# Patient Record
Sex: Female | Born: 1970 | Race: White | Hispanic: No
Health system: Southern US, Community
[De-identification: ages and names within clinical notes are randomized; demographics above are authoritative.]

## PROBLEM LIST (undated history)

## (undated) DIAGNOSIS — Z9889 Other specified postprocedural states: Secondary | ICD-10-CM

## (undated) DIAGNOSIS — M1990 Unspecified osteoarthritis, unspecified site: Secondary | ICD-10-CM

## (undated) DIAGNOSIS — N390 Urinary tract infection, site not specified: Secondary | ICD-10-CM

## (undated) DIAGNOSIS — R112 Nausea with vomiting, unspecified: Secondary | ICD-10-CM

## (undated) DIAGNOSIS — K219 Gastro-esophageal reflux disease without esophagitis: Secondary | ICD-10-CM

## (undated) DIAGNOSIS — Z8742 Personal history of other diseases of the female genital tract: Secondary | ICD-10-CM

## (undated) DIAGNOSIS — T8859XA Other complications of anesthesia, initial encounter: Secondary | ICD-10-CM

## (undated) DIAGNOSIS — U071 COVID-19: Secondary | ICD-10-CM

## (undated) DIAGNOSIS — R51 Headache: Secondary | ICD-10-CM

## (undated) DIAGNOSIS — B009 Herpesviral infection, unspecified: Secondary | ICD-10-CM

## (undated) HISTORY — PX: BREAST SURGERY: SHX581

## (undated) HISTORY — PX: TUBAL LIGATION: SHX77

## (undated) HISTORY — PX: WISDOM TOOTH EXTRACTION: SHX21

## (undated) HISTORY — PX: URETHRAL STRICTURE DILATATION: SHX477

---

## 1997-06-29 ENCOUNTER — Emergency Department (HOSPITAL_COMMUNITY): Admission: EM | Admit: 1997-06-29 | Discharge: 1997-06-30 | Payer: Self-pay | Admitting: Internal Medicine

## 1997-08-01 ENCOUNTER — Emergency Department (HOSPITAL_COMMUNITY): Admission: EM | Admit: 1997-08-01 | Discharge: 1997-08-02 | Payer: Self-pay | Admitting: Emergency Medicine

## 1997-08-02 ENCOUNTER — Ambulatory Visit (HOSPITAL_COMMUNITY): Admission: RE | Admit: 1997-08-02 | Discharge: 1997-08-02 | Payer: Self-pay | Admitting: Emergency Medicine

## 1997-08-17 ENCOUNTER — Other Ambulatory Visit: Admission: RE | Admit: 1997-08-17 | Discharge: 1997-08-17 | Payer: Self-pay | Admitting: Obstetrics and Gynecology

## 1997-12-01 ENCOUNTER — Ambulatory Visit (HOSPITAL_COMMUNITY): Admission: RE | Admit: 1997-12-01 | Discharge: 1997-12-01 | Payer: Self-pay | Admitting: Obstetrics and Gynecology

## 1998-03-07 ENCOUNTER — Inpatient Hospital Stay (HOSPITAL_COMMUNITY): Admission: AD | Admit: 1998-03-07 | Discharge: 1998-03-10 | Payer: Self-pay | Admitting: Obstetrics and Gynecology

## 1998-03-14 ENCOUNTER — Inpatient Hospital Stay (HOSPITAL_COMMUNITY): Admission: AD | Admit: 1998-03-14 | Discharge: 1998-03-14 | Payer: Self-pay | Admitting: Obstetrics & Gynecology

## 1999-04-06 ENCOUNTER — Encounter: Payer: Self-pay | Admitting: Emergency Medicine

## 1999-04-06 ENCOUNTER — Emergency Department (HOSPITAL_COMMUNITY): Admission: EM | Admit: 1999-04-06 | Discharge: 1999-04-07 | Payer: Self-pay | Admitting: Emergency Medicine

## 2000-12-24 ENCOUNTER — Emergency Department (HOSPITAL_COMMUNITY): Admission: EM | Admit: 2000-12-24 | Discharge: 2000-12-24 | Payer: Self-pay | Admitting: Emergency Medicine

## 2005-11-02 ENCOUNTER — Emergency Department (HOSPITAL_COMMUNITY): Admission: EM | Admit: 2005-11-02 | Discharge: 2005-11-03 | Payer: Self-pay | Admitting: Emergency Medicine

## 2007-06-02 ENCOUNTER — Emergency Department (HOSPITAL_COMMUNITY): Admission: EM | Admit: 2007-06-02 | Discharge: 2007-06-02 | Payer: Self-pay | Admitting: Emergency Medicine

## 2009-02-25 ENCOUNTER — Ambulatory Visit: Payer: Self-pay | Admitting: Internal Medicine

## 2010-01-30 ENCOUNTER — Encounter: Payer: Self-pay | Admitting: Family Medicine

## 2010-10-05 LAB — LIPASE, BLOOD: Lipase: 26

## 2010-10-05 LAB — COMPREHENSIVE METABOLIC PANEL
ALT: 15
Alkaline Phosphatase: 49
BUN: 13
CO2: 30
Chloride: 104
GFR calc non Af Amer: >60
Glucose, Bld: 118 — ABNORMAL HIGH
Potassium: 4.1
Sodium: 140
Total Bilirubin: 1.2
Total Protein: 7.3

## 2010-10-05 LAB — URINALYSIS, ROUTINE W REFLEX MICROSCOPIC
Bilirubin Urine: NEGATIVE
Nitrite: NEGATIVE
Protein, ur: NEGATIVE
Specific Gravity, Urine: 1.024
Urobilinogen, UA: 0.2

## 2010-10-05 LAB — DIFFERENTIAL
Basophils Absolute: 0.1
Basophils Relative: 1
Eosinophils Absolute: 0.3
Neutro Abs: 9.4 — ABNORMAL HIGH
Neutrophils Relative %: 75

## 2010-10-05 LAB — URINE MICROSCOPIC-ADD ON

## 2010-10-05 LAB — CBC
HCT: 41.6
Hemoglobin: 14.1
RBC: 4.58
RDW: 13.4

## 2010-10-05 LAB — GC/CHLAMYDIA PROBE AMP, GENITAL
Chlamydia, DNA Probe: NEGATIVE
GC Probe Amp, Genital: NEGATIVE

## 2010-10-05 LAB — POCT PREGNANCY, URINE: Operator id: 261601

## 2012-07-24 ENCOUNTER — Encounter (HOSPITAL_COMMUNITY): Payer: Self-pay | Admitting: Pharmacist

## 2012-07-25 ENCOUNTER — Encounter (HOSPITAL_COMMUNITY)
Admission: RE | Admit: 2012-07-25 | Discharge: 2012-07-25 | Disposition: A | Discharge status: Home / Self Care | Payer: Medicaid Other | Source: Ambulatory Visit | Attending: Obstetrics and Gynecology | Admitting: Obstetrics and Gynecology

## 2012-07-25 ENCOUNTER — Encounter (HOSPITAL_COMMUNITY): Payer: Self-pay

## 2012-07-25 DIAGNOSIS — Z01818 Encounter for other preprocedural examination: Secondary | ICD-10-CM | POA: Insufficient documentation

## 2012-07-25 DIAGNOSIS — Z01812 Encounter for preprocedural laboratory examination: Secondary | ICD-10-CM | POA: Insufficient documentation

## 2012-07-25 HISTORY — DX: Gastro-esophageal reflux disease without esophagitis: K21.9

## 2012-07-25 HISTORY — DX: Headache: R51

## 2012-07-25 LAB — CBC
MCH: 31.1 pg (ref 26.0–34.0)
MCHC: 34.9 g/dL (ref 30.0–36.0)
RDW: 13.3 % (ref 11.5–15.5)

## 2012-07-25 NOTE — Patient Instructions (Addendum)
Your procedure is scheduled on:08/02/12  Enter through the Main Entrance at :6am Pick up desk phone and dial [phone] and inform us of your arrival.  Please call [phone] if you have any problems the morning of surgery.  Remember: Do not eat food or drink liquids, including water, after midnight: Thursday  You may brush your teeth the morning of surgery.   DO NOT wear jewelry, eye make-up, lipstick,body lotion, or dark fingernail polish.  (Polished toes are ok) You may wear deodorant.  If you are to be admitted after surgery, leave suitcase in car until your room has been assigned. Patients discharged on the day of surgery will not be allowed to drive home. Wear loose fitting, comfortable clothes for your ride home.   /

## 2012-08-01 NOTE — H&P (Signed)
Erika Ford is an 42 y.o. female. She has been seen in the office over the past few months for persistent pelvic pain.  Pelvic ultrasound in June was normal.  She does have some symptoms c/w interstitial cystitis, but her vaginal fornices are tender  Pertinent Gynecological History: Last pap: abnormal: ASCUS with neg HPV Date: 07/2011 OB History: G7, P4034 SVD at term x 3, c-section and BTL with last delivery, EAB x 3   Menstrual History: No LMP recorded.    Past Medical History  Diagnosis Date  . GERD (gastroesophageal reflux disease)     no meds  . Headache(784.0)     h/o migraines    Past Surgical History  Procedure Laterality Date  . Tubal ligation    . Urethral stricture dilatation    C-section  No family history on file.  Social History:  reports that she has been smoking Cigarettes.  She has been smoking about 0.50 packs per day. She does not have any smokeless tobacco history on file. She reports that  drinks alcohol. She reports that she does not use illicit drugs.  Allergies: No Known Allergies  No prescriptions prior to admission    Review of Systems  Respiratory: Negative.   Cardiovascular: Negative.   Gastrointestinal: Negative.   Genitourinary: Positive for dysuria. Negative for urgency, frequency and hematuria.    There were no vitals taken for this visit. Physical Exam  Constitutional: She appears well-developed and well-nourished.  Cardiovascular: Normal rate, regular rhythm and normal heart sounds.   No murmur heard. Respiratory: Effort normal and breath sounds normal. No respiratory distress. She has no wheezes.  GI: Soft. She exhibits no distension and no mass. There is tenderness (slightly tender suprapubically). There is no rebound and no guarding.  Genitourinary: Vagina normal.  Uterus midplanar to retroverted, normal size, NT No adnexal mass or tenderness Tender vaginal fornices bilaterally    No results found for this or any previous  visit (from the past 24 hour(s)).  No results found.  Assessment/Plan: Chronic pelvic pain.  Discussed that this may be IC, but tenderness could also be from endometriosis.  Discussed all medical and surgical options, she wants to proceed with laparoscopy.  Procedure and risks discussed, as well as possibility that everything will look normal.  Will admit for diagnostic/possible operative laparoscopy.  Erika Ford D 08/01/2012, 6:59 PM

## 2012-08-02 ENCOUNTER — Encounter (HOSPITAL_COMMUNITY): Payer: Self-pay | Admitting: Anesthesiology

## 2012-08-02 ENCOUNTER — Ambulatory Visit (HOSPITAL_COMMUNITY): Payer: Medicaid Other | Admitting: Anesthesiology

## 2012-08-02 ENCOUNTER — Encounter (HOSPITAL_COMMUNITY): Payer: Self-pay | Admitting: *Deleted

## 2012-08-02 ENCOUNTER — Encounter (HOSPITAL_COMMUNITY)
Admission: AD | Disposition: A | Discharge status: Home / Self Care | Payer: Self-pay | Source: Ambulatory Visit | Attending: Obstetrics and Gynecology

## 2012-08-02 ENCOUNTER — Ambulatory Visit (HOSPITAL_COMMUNITY)
Admission: AD | Admit: 2012-08-02 | Discharge: 2012-08-02 | Disposition: A | Discharge status: Home / Self Care | Payer: Medicaid Other | Source: Ambulatory Visit | Attending: Obstetrics and Gynecology | Admitting: Obstetrics and Gynecology

## 2012-08-02 DIAGNOSIS — R102 Pelvic and perineal pain unspecified side: Secondary | ICD-10-CM | POA: Diagnosis present

## 2012-08-02 DIAGNOSIS — N949 Unspecified condition associated with female genital organs and menstrual cycle: Secondary | ICD-10-CM | POA: Insufficient documentation

## 2012-08-02 DIAGNOSIS — G8929 Other chronic pain: Secondary | ICD-10-CM | POA: Insufficient documentation

## 2012-08-02 HISTORY — DX: Urinary tract infection, site not specified: N39.0

## 2012-08-02 HISTORY — DX: Herpesviral infection, unspecified: B00.9

## 2012-08-02 HISTORY — PX: LAPAROSCOPY: SHX197

## 2012-08-02 SURGERY — LAPAROSCOPY OPERATIVE
Anesthesia: General | Site: Abdomen | Wound class: Clean Contaminated

## 2012-08-02 MED ORDER — LACTATED RINGERS IV SOLN
INTRAVENOUS | Status: DC
  Administered 2012-08-02 (×2): via INTRAVENOUS

## 2012-08-02 MED ORDER — BUPIVACAINE HCL (PF) 0.25 % IJ SOLN
INTRAMUSCULAR | Status: AC
  Filled 2012-08-02: qty 30

## 2012-08-02 MED ORDER — METOCLOPRAMIDE HCL 5 MG/ML IJ SOLN
10.0000 mg | Freq: Once | INTRAMUSCULAR | Status: AC
  Administered 2012-08-02: 10 mg via INTRAVENOUS

## 2012-08-02 MED ORDER — BUPIVACAINE HCL (PF) 0.25 % IJ SOLN
INTRAMUSCULAR | Status: DC | PRN
  Administered 2012-08-02: 5 mL

## 2012-08-02 MED ORDER — PROPOFOL 10 MG/ML IV EMUL
INTRAVENOUS | Status: AC
  Filled 2012-08-02: qty 20

## 2012-08-02 MED ORDER — ACETAMINOPHEN 160 MG/5ML PO SOLN
ORAL | Status: AC
  Administered 2012-08-02: 975 mg via ORAL
  Filled 2012-08-02: qty 40.6

## 2012-08-02 MED ORDER — LACTATED RINGERS IV SOLN
INTRAVENOUS | Status: DC
  Administered 2012-08-02: 125 mL/h via INTRAVENOUS
  Administered 2012-08-02: 07:00:00 via INTRAVENOUS

## 2012-08-02 MED ORDER — GLYCOPYRROLATE 0.2 MG/ML IJ SOLN
INTRAMUSCULAR | Status: AC
  Filled 2012-08-02: qty 5

## 2012-08-02 MED ORDER — ONDANSETRON HCL 4 MG/2ML IJ SOLN
INTRAMUSCULAR | Status: AC
  Filled 2012-08-02: qty 2

## 2012-08-02 MED ORDER — MIDAZOLAM HCL 2 MG/2ML IJ SOLN
INTRAMUSCULAR | Status: AC
  Filled 2012-08-02: qty 2

## 2012-08-02 MED ORDER — FENTANYL CITRATE 0.05 MG/ML IJ SOLN: 25.0000 ug | INTRAMUSCULAR | Status: DC | PRN

## 2012-08-02 MED ORDER — MIDAZOLAM HCL 5 MG/5ML IJ SOLN
INTRAMUSCULAR | Status: DC | PRN
  Administered 2012-08-02: 2 mg via INTRAVENOUS

## 2012-08-02 MED ORDER — FENTANYL CITRATE 0.05 MG/ML IJ SOLN
INTRAMUSCULAR | Status: AC
  Filled 2012-08-02: qty 5

## 2012-08-02 MED ORDER — LIDOCAINE HCL (CARDIAC) 20 MG/ML IV SOLN
INTRAVENOUS | Status: DC | PRN
  Administered 2012-08-02: 10 mg via INTRAVENOUS

## 2012-08-02 MED ORDER — ROCURONIUM BROMIDE 50 MG/5ML IV SOLN
INTRAVENOUS | Status: AC
  Filled 2012-08-02: qty 1

## 2012-08-02 MED ORDER — SODIUM CHLORIDE 0.9 % IJ SOLN
INTRAMUSCULAR | Status: DC | PRN
  Administered 2012-08-02: 10 mL

## 2012-08-02 MED ORDER — KETOROLAC TROMETHAMINE 30 MG/ML IJ SOLN
INTRAMUSCULAR | Status: DC | PRN
  Administered 2012-08-02: 30 mg via INTRAVENOUS

## 2012-08-02 MED ORDER — KETOROLAC TROMETHAMINE 30 MG/ML IJ SOLN
INTRAMUSCULAR | Status: AC
  Filled 2012-08-02: qty 1

## 2012-08-02 MED ORDER — PROMETHAZINE HCL 25 MG/ML IJ SOLN: 6.2500 mg | Freq: Once | INTRAMUSCULAR | Status: AC

## 2012-08-02 MED ORDER — GLYCOPYRROLATE 0.2 MG/ML IJ SOLN
INTRAMUSCULAR | Status: AC
  Filled 2012-08-02: qty 1

## 2012-08-02 MED ORDER — NEOSTIGMINE METHYLSULFATE 1 MG/ML IJ SOLN
INTRAMUSCULAR | Status: DC | PRN
  Administered 2012-08-02: 5 mg via INTRAVENOUS

## 2012-08-02 MED ORDER — KETOROLAC TROMETHAMINE 30 MG/ML IJ SOLN: 15.0000 mg | Freq: Once | INTRAMUSCULAR | Status: DC | PRN

## 2012-08-02 MED ORDER — FENTANYL CITRATE 0.05 MG/ML IJ SOLN
INTRAMUSCULAR | Status: DC | PRN
  Administered 2012-08-02: 150 ug via INTRAVENOUS
  Administered 2012-08-02: 100 ug via INTRAVENOUS

## 2012-08-02 MED ORDER — DEXAMETHASONE SODIUM PHOSPHATE 10 MG/ML IJ SOLN
INTRAMUSCULAR | Status: AC
  Filled 2012-08-02: qty 1

## 2012-08-02 MED ORDER — PROPOFOL 10 MG/ML IV BOLUS
INTRAVENOUS | Status: DC | PRN
  Administered 2012-08-02: 200 mg via INTRAVENOUS

## 2012-08-02 MED ORDER — METOCLOPRAMIDE HCL 5 MG/ML IJ SOLN
INTRAMUSCULAR | Status: AC
  Filled 2012-08-02: qty 2

## 2012-08-02 MED ORDER — LIDOCAINE HCL (CARDIAC) 20 MG/ML IV SOLN
INTRAVENOUS | Status: AC
  Filled 2012-08-02: qty 5

## 2012-08-02 MED ORDER — EPHEDRINE 5 MG/ML INJ
INTRAVENOUS | Status: AC
  Filled 2012-08-02: qty 10

## 2012-08-02 MED ORDER — HYDROCODONE-ACETAMINOPHEN 5-325 MG PO TABS: 1.0000 | ORAL_TABLET | ORAL | Status: DC | PRN

## 2012-08-02 MED ORDER — PROMETHAZINE HCL 25 MG/ML IJ SOLN
INTRAMUSCULAR | Status: AC
  Administered 2012-08-02: 6.25 mg via INTRAVENOUS
  Filled 2012-08-02: qty 1

## 2012-08-02 MED ORDER — ONDANSETRON HCL 4 MG/2ML IJ SOLN: INTRAMUSCULAR | Status: DC | PRN

## 2012-08-02 MED ORDER — GLYCOPYRROLATE 0.2 MG/ML IJ SOLN
INTRAMUSCULAR | Status: DC | PRN
  Administered 2012-08-02: 0.2 mg via INTRAVENOUS
  Administered 2012-08-02: 1 mg via INTRAVENOUS

## 2012-08-02 MED ORDER — EPHEDRINE SULFATE 50 MG/ML IJ SOLN
INTRAMUSCULAR | Status: DC | PRN
  Administered 2012-08-02: 10 mg via INTRAVENOUS

## 2012-08-02 MED ORDER — NEOSTIGMINE METHYLSULFATE 1 MG/ML IJ SOLN
INTRAMUSCULAR | Status: AC
  Filled 2012-08-02: qty 1

## 2012-08-02 MED ORDER — DEXAMETHASONE SODIUM PHOSPHATE 10 MG/ML IJ SOLN
INTRAMUSCULAR | Status: DC | PRN
  Administered 2012-08-02: 10 mg via INTRAVENOUS

## 2012-08-02 MED ORDER — ROCURONIUM BROMIDE 100 MG/10ML IV SOLN
INTRAVENOUS | Status: DC | PRN
  Administered 2012-08-02: 35 mg via INTRAVENOUS

## 2012-08-02 MED ORDER — ACETAMINOPHEN 160 MG/5ML PO SOLN
975.0000 mg | Freq: Once | ORAL | Status: AC
  Administered 2012-08-02: 975 mg via ORAL

## 2012-08-02 MED ORDER — ONDANSETRON HCL 4 MG/2ML IJ SOLN
INTRAMUSCULAR | Status: DC | PRN
  Administered 2012-08-02: 4 mg via INTRAVENOUS

## 2012-08-02 SURGICAL SUPPLY — 25 items
ADH SKN CLS APL DERMABOND .7 (GAUZE/BANDAGES/DRESSINGS) ×1
CATH ROBINSON RED A/P 16FR (CATHETERS) ×1 IMPLANT
CHLORAPREP W/TINT 26ML (MISCELLANEOUS) ×2 IMPLANT
CLOTH BEACON ORANGE TIMEOUT ST (SAFETY) ×2 IMPLANT
DECANTER SPIKE VIAL GLASS SM (MISCELLANEOUS) ×2 IMPLANT
DERMABOND ADVANCED (GAUZE/BANDAGES/DRESSINGS) ×1
DERMABOND ADVANCED .7 DNX12 (GAUZE/BANDAGES/DRESSINGS) ×1 IMPLANT
GLOVE BIO SURGEON STRL SZ8 (GLOVE) ×3 IMPLANT
GLOVE BIOGEL PI IND STRL 7.0 (GLOVE) IMPLANT
GLOVE BIOGEL PI INDICATOR 7.0 (GLOVE) ×1
GLOVE ORTHO TXT STRL SZ7.5 (GLOVE) ×2 IMPLANT
GLOVE SURG SS PI 6.5 STRL IVOR (GLOVE) ×1 IMPLANT
GLOVE SURG SS PI 7.0 STRL IVOR (GLOVE) ×3 IMPLANT
GOWN PREVENTION PLUS LG XLONG (DISPOSABLE) ×2 IMPLANT
GOWN PREVENTION PLUS XXLARGE (GOWN DISPOSABLE) ×2 IMPLANT
GOWN STRL REIN XL XLG (GOWN DISPOSABLE) ×1 IMPLANT
NEEDLE INSUFFLATION 120MM (ENDOMECHANICALS) ×2 IMPLANT
NS IRRIG 1000ML POUR BTL (IV SOLUTION) ×2 IMPLANT
PACK LAPAROSCOPY BASIN (CUSTOM PROCEDURE TRAY) ×2 IMPLANT
PROTECTOR NERVE ULNAR (MISCELLANEOUS) ×2 IMPLANT
SUT VICRYL 4-0 PS2 18IN ABS (SUTURE) ×2 IMPLANT
TOWEL OR 17X24 6PK STRL BLUE (TOWEL DISPOSABLE) ×4 IMPLANT
TROCAR XCEL NON-BLD 5MMX100MML (ENDOMECHANICALS) ×3 IMPLANT
WARMER LAPAROSCOPE (MISCELLANEOUS) ×2 IMPLANT
WATER STERILE IRR 1000ML POUR (IV SOLUTION) ×2 IMPLANT

## 2012-08-02 NOTE — Op Note (Signed)
Preoperative diagnosis: Pelvic pain Postoperative diagnosis: Same Procedure: Diagnostic laparoscopy Surgeon: Lavina Hamman M.D. Anesthesia: Gen. Endotracheal tube Findings: She had a normal abdomen and pelvis with normal uterus tubes and ovaries, evidence of prior BTL Specimens: None Estimated blood loss: Minimal Complications: None  Procedure in detail  The patient was taken to the operating room and placed in the dorsosupine position. General anesthesia was induced. Her legs were placed in mobile stirrups and her left arm was tucked to her side. Abdomen perineum and vagina were then prepped and draped in the usual sterile fashion, bladder drained with a red Robinson catheter, a Hulka tenaculum was applied to the cervix for uterine manipulation. Infraumbilical skin was then infiltrated with quarter percent Marcaine and a 1 cm vertical incision was made. The veress needle was inserted into the peritoneal cavity and placement confirmed by the water drop test and an opening pressure of 6 mm of mercury. CO2 was insufflated to a pressure of 12 mm of mercury and the veress needle was removed. A 5mm disposable trocar was then introduced with direct visualization with the laparoscope. A 5 mm port was then placed on the left side also under direct visualization. Careful and thorough inspection revealed the above-mentioned findings with normal anatomy, no source for her pain was identified. There were a few adhesions of bowel to the anterior abdominal wall on the upper right side.  The 5 mm port was removed under direct visualization. All gas was allowed to deflate from the abdomen and the umbilical trocar was removed. Skin incisions were then closed with interrupted subcuticular sutures of 4-0 Vicryl followed by Dermabond. The Hulka tenaculum was removed. The patient was taken down from stirrups. She was awakened in the operating room and taken to the recovery room in stable condition after tolerating the  procedure well. Counts were correct and she had PAS hose on throughout the procedure.

## 2012-08-02 NOTE — Interval H&P Note (Signed)
History and Physical Interval Note:  08/02/2012 7:11 AM  [employer]  has presented today for surgery, with the diagnosis of pelvic pain,   The various methods of treatment have been discussed with the patient and family. After consideration of risks, benefits and other options for treatment, the patient has consented to  Procedure(s): LAPAROSCOPY DIAGNOSTIC POSSIBLE OPERATIVE (N/A) as a surgical intervention .  The patient's history has been reviewed, patient examined, no change in status, stable for surgery.  I have reviewed the patient's chart and labs.  Questions were answered to the patient's satisfaction.     Dustine Bertini D

## 2012-08-02 NOTE — Anesthesia Postprocedure Evaluation (Signed)
  Anesthesia Post-op Note  Patient: Erika Ford  Procedure(s) Performed: Procedure(s): LAPAROSCOPY DIAGNOSTIC POSSIBLE OPERATIVE (N/A)  Patient Location: PACU  Anesthesia Type:General  Level of Consciousness: awake, alert  and oriented  Airway and Oxygen Therapy: Patient Spontanous Breathing  Post-op Pain: mild  Post-op Assessment: Post-op Vital signs reviewed, Patient's Cardiovascular Status Stable, Respiratory Function Stable, Patent Airway, No signs of Nausea or vomiting and Pain level controlled  Post-op Vital Signs: Reviewed and stable  Complications: No apparent anesthesia complications

## 2012-08-02 NOTE — Anesthesia Procedure Notes (Signed)
Procedure Name: Intubation Date/Time: 08/02/2012 7:42 AM Performed by: Lincoln Brigham Pre-anesthesia Checklist: Patient identified, Suction available, Emergency Drugs available, Patient being monitored and Timeout performed Patient Re-evaluated:Patient Re-evaluated prior to inductionOxygen Delivery Method: Circle system utilized Preoxygenation: Pre-oxygenation with 100% oxygen Intubation Type: IV induction Ventilation: Mask ventilation without difficulty Laryngoscope Size: Miller and 2 Grade View: Grade I Tube type: Oral Tube size: 7.0 mm Number of attempts: 1 Airway Equipment and Method: Stylet Placement Confirmation: ETT inserted through vocal cords under direct vision,  positive ETCO2 and breath sounds checked- equal and bilateral Secured at: 23 cm Tube secured with: Tape Dental Injury: Teeth and Oropharynx as per pre-operative assessment

## 2012-08-02 NOTE — Anesthesia Preprocedure Evaluation (Signed)
Anesthesia Evaluation  Patient identified by MRN, date of birth, ID band Patient awake    Reviewed: Allergy & Precautions, H&P , NPO status , Patient's Chart, lab work & pertinent test results, reviewed documented beta blocker date and time   History of Anesthesia Complications Negative for: history of anesthetic complications  Airway Mallampati: I TM Distance: >3 FB Neck ROM: full    Dental  (+) Poor Dentition   Pulmonary Current Smoker (1 ppd),  Smoker's cough breath sounds clear to auscultation  Pulmonary exam normal       Cardiovascular Exercise Tolerance: Good Rhythm:regular Rate:Normal     Neuro/Psych negative psych ROS   GI/Hepatic Neg liver ROS, GERD- ("needs" to be on medicine)  ,  Endo/Other  negative endocrine ROS  Renal/GU negative Renal ROS  Female GU complaint (pelvic pain - taking ibuprofen and vicodin)     Musculoskeletal   Abdominal   Peds  Hematology negative hematology ROS (+)   Anesthesia Other Findings   Reproductive/Obstetrics negative OB ROS                           Anesthesia Physical Anesthesia Plan  ASA: II  Anesthesia Plan: General ETT   Post-op Pain Management:    Induction:   Airway Management Planned:   Additional Equipment:   Intra-op Plan:   Post-operative Plan:   Informed Consent: I have reviewed the patients History and Physical, chart, labs and discussed the procedure including the risks, benefits and alternatives for the proposed anesthesia with the patient or authorized representative who has indicated his/her understanding and acceptance.   Dental Advisory Given  Plan Discussed with: CRNA and Surgeon  Anesthesia Plan Comments:         Anesthesia Quick Evaluation

## 2012-08-02 NOTE — Transfer of Care (Signed)
Immediate Anesthesia Transfer of Care Note  Patient: Erika Ford  Procedure(s) Performed: Procedure(s): LAPAROSCOPY DIAGNOSTIC POSSIBLE OPERATIVE (N/A)  Patient Location: PACU  Anesthesia Type:General  Level of Consciousness: awake, alert , oriented and patient cooperative  Airway & Oxygen Therapy: Patient Spontanous Breathing and Patient connected to nasal cannula oxygen  Post-op Assessment: Report given to PACU RN and Post -op Vital signs reviewed and stable  Post vital signs: Reviewed and stable  Complications: No apparent anesthesia complications

## 2012-08-05 ENCOUNTER — Encounter (HOSPITAL_COMMUNITY): Payer: Self-pay | Admitting: Obstetrics and Gynecology

## 2014-03-02 ENCOUNTER — Other Ambulatory Visit (HOSPITAL_COMMUNITY)
Admission: RE | Admit: 2014-03-02 | Discharge: 2014-03-02 | Disposition: A | Discharge status: Home / Self Care | Payer: Medicaid Other | Source: Ambulatory Visit | Attending: Emergency Medicine | Admitting: Emergency Medicine

## 2014-03-02 ENCOUNTER — Emergency Department (INDEPENDENT_AMBULATORY_CARE_PROVIDER_SITE_OTHER)
Admission: EM | Admit: 2014-03-02 | Discharge: 2014-03-02 | Disposition: A | Discharge status: Home / Self Care | Payer: Self-pay | Source: Home / Self Care | Attending: Emergency Medicine | Admitting: Emergency Medicine

## 2014-03-02 ENCOUNTER — Encounter (HOSPITAL_COMMUNITY): Payer: Self-pay

## 2014-03-02 DIAGNOSIS — N730 Acute parametritis and pelvic cellulitis: Secondary | ICD-10-CM

## 2014-03-02 DIAGNOSIS — N760 Acute vaginitis: Secondary | ICD-10-CM | POA: Insufficient documentation

## 2014-03-02 DIAGNOSIS — Z113 Encounter for screening for infections with a predominantly sexual mode of transmission: Secondary | ICD-10-CM | POA: Insufficient documentation

## 2014-03-02 LAB — POCT URINALYSIS DIP (DEVICE)
Bilirubin Urine: NEGATIVE
Glucose, UA: NEGATIVE mg/dL
KETONES UR: NEGATIVE mg/dL
LEUKOCYTES UA: NEGATIVE
NITRITE: NEGATIVE
Protein, ur: NEGATIVE mg/dL
Specific Gravity, Urine: 1.025 (ref 1.005–1.030)
UROBILINOGEN UA: 0.2 mg/dL (ref 0.0–1.0)
pH: 5.5 (ref 5.0–8.0)

## 2014-03-02 LAB — POCT PREGNANCY, URINE: PREG TEST UR: NEGATIVE

## 2014-03-02 MED ORDER — CEFTRIAXONE SODIUM 250 MG IJ SOLR
250.0000 mg | Freq: Once | INTRAMUSCULAR | Status: AC
  Administered 2014-03-02: 250 mg via INTRAMUSCULAR

## 2014-03-02 MED ORDER — AZITHROMYCIN 250 MG PO TABS
1000.0000 mg | ORAL_TABLET | Freq: Once | ORAL | Status: AC
  Administered 2014-03-02: 1000 mg via ORAL

## 2014-03-02 MED ORDER — AZITHROMYCIN 250 MG PO TABS
ORAL_TABLET | ORAL | Status: AC
  Filled 2014-03-02: qty 4

## 2014-03-02 MED ORDER — CEFTRIAXONE SODIUM 250 MG IJ SOLR
INTRAMUSCULAR | Status: AC
  Filled 2014-03-02: qty 250

## 2014-03-02 MED ORDER — LIDOCAINE HCL (PF) 1 % IJ SOLN
INTRAMUSCULAR | Status: AC
  Filled 2014-03-02: qty 5

## 2014-03-02 MED ORDER — METRONIDAZOLE 500 MG PO TABS: 500.0000 mg | ORAL_TABLET | Freq: Two times a day (BID) | ORAL | Status: DC

## 2014-03-02 MED ORDER — DOXYCYCLINE HYCLATE 100 MG PO CAPS: 100.0000 mg | ORAL_CAPSULE | Freq: Two times a day (BID) | ORAL | Status: DC

## 2014-03-02 NOTE — Discharge Instructions (Signed)
As we discussed, I am concerned about your level of discomfort and I would like you to have a pelvic ultrasound as soon as possible. This can either be arranged by your ObGyn provider at [facility] or by [facility] for evaluation. Please take medications as directed and if your labs indicate the need for additional treatment, you will be notified by phone.  Pelvic Inflammatory Disease Pelvic inflammatory disease (PID) refers to an infection in some or all of the female organs. The infection can be in the uterus, ovaries, fallopian tubes, or the surrounding tissues in the pelvis. PID can cause abdominal or pelvic pain that comes on suddenly (acute pelvic pain). PID is a serious infection because it can lead to lasting (chronic) pelvic pain or the inability to have children (infertile).  CAUSES  The infection is often caused by the normal bacteria found in the vaginal tissues. PID may also be caused by an infection that is spread during sexual contact. PID can also occur following:   The birth of a baby.   A miscarriage.   An abortion.   Major pelvic surgery.   The use of an intrauterine device (IUD).   A sexual assault.  RISK FACTORS Certain factors can put a person at higher risk for PID, such as:  Being younger than 25 years.  Being sexually active at [place] age.  Usingnonbarrier contraception.  Havingmultiple sexual partners.  Having sex with someone who has symptoms of a genital infection.  Using oral contraception. Other times, certain behaviors can increase the possibility of getting PID, such as:  Having sex during your period.  Using a vaginal douche.  Having an intrauterine device (IUD) in place. SYMPTOMS   Abdominal or pelvic pain.   Fever.   Chills.   Abnormal vaginal discharge.  Abnormal uterine bleeding.   Unusual pain shortly after finishing your period. DIAGNOSIS  Your caregiver will choose  some of the following methods to make a diagnosis, such as:   Performinga physical exam and history. A pelvic exam typically reveals a very tender uterus and surrounding pelvis.   Ordering laboratory tests including a pregnancy test, blood tests, and urine test.  Orderingcultures of the vagina and cervix to check for a sexually transmitted infection (STI).  Performing an ultrasound.   Performing a laparoscopic procedure to look inside the pelvis.  TREATMENT   Antibiotic medicines may be prescribed and taken by mouth.   Sexual partners may be treated when the infection is caused by a sexually transmitted disease (STD).   Hospitalization may be needed to give antibiotics intravenously.  Surgery may be needed, but this is rare. It may take weeks until you are completely well. If you are diagnosed with PID, you should also be checked for human immunodeficiency virus (HIV). HOME CARE INSTRUCTIONS   If given, take your antibiotics as directed. Finish the medicine even if you start to feel better.   Only take over-the-counter or prescription medicines for pain, discomfort, or fever as directed by your caregiver.   Do not have sexual intercourse until treatment is completed or as directed by your caregiver. If PID is confirmed, your recent sexual partner(s) will need treatment.   Keep your follow-up appointments. SEEK MEDICAL CARE IF:   You have increased or abnormal vaginal discharge.   You need prescription medicine for your pain.   You vomit.   You cannot take your medicines.   Your partner has an STD.  SEEK IMMEDIATE MEDICAL  CARE IF:   You have a fever.   You have increased abdominal or pelvic pain.   You have chills.   You have pain when you urinate.   You are not better after 72 hours following treatment.  MAKE SURE YOU:   Understand these instructions.  Will watch your condition.  Will get help right away if you are not doing well or get  worse. Document Released: 12/26/2004 Document Revised: 04/22/2012 Document Reviewed: 12/22/2010 [facility] Patient Information 2015 [place], [place]. This information is not intended to replace advice given to you by your health care provider. Make sure you discuss any questions you have with your health care provider.

## 2014-03-02 NOTE — ED Provider Notes (Signed)
CSN: [id number]     Arrival date & time 03/02/14  0904 History   First MD Initiated Contact with Patient 03/02/14 [phone]     Chief Complaint  Patient presents with  . Abdominal Pain   (Consider location/radiation/quality/duration/timing/severity/associated sxs/prior Treatment) HPI Comments: LNMP:  02/11/2014  Patient is a 44 y.o. female presenting with vaginal discharge. The history is provided by the patient.  Vaginal Discharge Quality:  Yellow and malodorous Onset quality:  Gradual Duration:  2 weeks Timing:  Constant Progression:  Worsening Chronicity:  New Associated symptoms: no abdominal pain, no dyspareunia, no dysuria, no fever, no genital lesions, no nausea, no rash, no urinary frequency, no urinary hesitancy, no urinary incontinence, no vaginal itching and no vomiting   Associated symptoms comment:  +pelvic pain   Past Medical History  Diagnosis Date  . GERD (gastroesophageal reflux disease)     no meds  . Headache(784.0)     h/o migraines  . Herpes   . SVD (spontaneous vaginal delivery)     x 3  . UTI (lower urinary tract infection)     on abx   Past Surgical History  Procedure Laterality Date  . Tubal ligation    . Urethral stricture dilatation    . Cesarean section    . Wisdom tooth extraction    . Breast surgery      breast implants  . Laparoscopy N/A 08/02/2012    Procedure: LAPAROSCOPY DIAGNOSTIC POSSIBLE OPERATIVE;  Surgeon: Lavina Hammanodd Meisinger, MD;  Location: [facility];  Service: Gynecology;  Laterality: N/A;   History reviewed. No pertinent family history. History  Substance Use Topics  . Smoking status: Current Every Day Smoker -- 0.50 packs/day    Types: Cigarettes  . Smokeless tobacco: Never Used  . Alcohol Use: Yes     Comment: occaasionally   OB History    No data available     Review of Systems  Constitutional: Negative for fever and chills.  HENT: Negative.   Eyes: Negative.   Respiratory: Negative.   Cardiovascular: Negative.    Gastrointestinal: Negative for nausea, vomiting, abdominal pain, diarrhea, constipation, blood in stool, abdominal distention and rectal pain.  Genitourinary: Positive for vaginal discharge and pelvic pain. Negative for bladder incontinence, dysuria, hesitancy, urgency, frequency, hematuria, flank pain, vaginal bleeding, difficulty urinating, genital sores, vaginal pain, menstrual problem and dyspareunia.    Allergies  Review of patient's allergies indicates no known allergies.  Home Medications   Prior to Admission medications   Medication Sig Start Date End Date Taking? Authorizing Provider  doxycycline (VIBRAMYCIN) 100 MG capsule Take 1 capsule (100 mg total) by mouth 2 (two) times daily. X 14 days 03/02/14   Ria ClockJennifer Lee H Doria Fern, PA  HYDROcodone-acetaminophen (NORCO) 5-325 MG per tablet Take 1-2 tablets by mouth every 4 (four) hours as needed for pain. 08/02/12   Lavina Hammanodd Meisinger, MD  ibuprofen (ADVIL,MOTRIN) 200 MG tablet Take 800 mg by mouth every 6 (six) hours as needed for pain or headache.    Historical Provider, MD  metroNIDAZOLE (FLAGYL) 500 MG tablet Take 1 tablet (500 mg total) by mouth 2 (two) times daily. X 14 days 03/02/14   Mathis FareJennifer Lee H Cloris Flippo, PA  terconazole (TERAZOL 3) 0.8 % vaginal cream Place 1 applicator vaginally at bedtime. Nightly for 3 days 06/28/12   Historical Provider, MD  valACYclovir (VALTREX) 500 MG tablet Take 500 mg by mouth daily.    Historical Provider, MD   BP 108/73 mmHg  Pulse 62  Temp(Src) 98.5 F (  36.9 C) (Oral)  Resp 16  SpO2 96% Physical Exam  Constitutional: She is oriented to person, place, and time. She appears well-developed and well-nourished. No distress.  HENT:  Head: Normocephalic and atraumatic.  Eyes: Conjunctivae are normal.  Neck: Normal range of motion. Neck supple.  Cardiovascular: Normal rate.   Pulmonary/Chest: Effort normal.  Abdominal: Soft. Normal appearance and bowel sounds are normal. She exhibits no distension and no  mass. There is tenderness in the suprapubic area. There is no rigidity, no rebound, no guarding and no CVA tenderness. Hernia confirmed negative in the right inguinal area and confirmed negative in the left inguinal area.  Genitourinary: Pelvic exam was performed with patient supine. There is no rash, tenderness or lesion on the right labia. There is no rash, tenderness or lesion on the left labia. Uterus is tender. Uterus is not deviated, not enlarged and not fixed. Cervix exhibits motion tenderness. Cervix exhibits no discharge and no friability. Right adnexum displays tenderness. Right adnexum displays no mass and no fullness. Left adnexum displays tenderness. Left adnexum displays no mass and no fullness. No erythema, tenderness or bleeding in the vagina. No foreign body around the vagina. No signs of injury around the vagina. No vaginal discharge found.  +moderate tenderness with palpation during bimanual exam  Musculoskeletal: Normal range of motion.  Lymphadenopathy:       Right: No inguinal adenopathy present.       Left: No inguinal adenopathy present.  Neurological: She is alert and oriented to person, place, and time.  Skin: Skin is warm and dry. No rash noted.  Psychiatric: She has a normal mood and affect. Her behavior is normal.  Nursing note and vitals reviewed.   ED Course  Procedures (including critical care time) Labs Review Labs Reviewed  POCT URINALYSIS DIP (DEVICE) - Abnormal; Notable for the following:    Hgb urine dipstick MODERATE (*)    All other components within normal limits  URINE CULTURE  POCT PREGNANCY, URINE  CERVICOVAGINAL ANCILLARY ONLY    Imaging Review No results found.   MDM   1. PID (acute pelvic inflammatory disease)   Patient treated empirically for gonorrhea and chlamydia while at [facility] with ceftriaxone  IM and azithromycin  po. Will continue to treat for PID at home with 14 days of flagyl and doxycycline. No indication of sepsis or  need for admission as patient is afebrile, without tachycardia and is normotensive. I did state to patient that her degree of discomfort during bimanual exam concerns me and advised her to either contact her ObGyn provider at [facility] for pelvic U/S or present to [facility] for further evaluation. She agrees with this treatment plan. Given note for employer to excuse her for today to allow for continued evaluation.   Ria Clock, [place] 03/02/14 1047

## 2014-03-02 NOTE — ED Notes (Signed)
C/o lower abdominal pain since 2-1. Pain gradually worse , w odor, discharge

## 2014-03-02 NOTE — ED Notes (Signed)
Call back number for lab issues verified at release  

## 2014-03-03 LAB — CERVICOVAGINAL ANCILLARY ONLY
CHLAMYDIA, DNA PROBE: NEGATIVE
Neisseria Gonorrhea: NEGATIVE
Wet Prep (BD Affirm): NEGATIVE
Wet Prep (BD Affirm): NEGATIVE
Wet Prep (BD Affirm): POSITIVE — AB

## 2014-03-03 LAB — URINE CULTURE: Colony Count: 5000

## 2014-03-03 NOTE — ED Notes (Addendum)
GC/Chlamydia neg., Affirm: Candida and Trich neg., Gardnerella pos. Pt. adequately treated with Flagyl.  Urine culture: Insignificant growth. Vassie MoselleYork, Jasmene Goswami M 03/03/2014

## 2015-05-10 DIAGNOSIS — Z6821 Body mass index (BMI) 21.0-21.9, adult: Secondary | ICD-10-CM | POA: Diagnosis not present

## 2015-05-10 DIAGNOSIS — Z130 Encounter for screening for diseases of the blood and blood-forming organs and certain disorders involving the immune mechanism: Secondary | ICD-10-CM | POA: Diagnosis not present

## 2015-05-10 DIAGNOSIS — Z716 Tobacco abuse counseling: Secondary | ICD-10-CM | POA: Diagnosis not present

## 2015-05-10 DIAGNOSIS — N898 Other specified noninflammatory disorders of vagina: Secondary | ICD-10-CM | POA: Diagnosis not present

## 2015-05-10 DIAGNOSIS — R8290 Unspecified abnormal findings in urine: Secondary | ICD-10-CM | POA: Diagnosis not present

## 2015-05-10 DIAGNOSIS — A609 Anogenital herpesviral infection, unspecified: Secondary | ICD-10-CM | POA: Diagnosis not present

## 2015-05-10 DIAGNOSIS — Z1389 Encounter for screening for other disorder: Secondary | ICD-10-CM | POA: Diagnosis not present

## 2015-05-10 DIAGNOSIS — Z01419 Encounter for gynecological examination (general) (routine) without abnormal findings: Secondary | ICD-10-CM | POA: Diagnosis not present

## 2015-11-17 DIAGNOSIS — G4700 Insomnia, unspecified: Secondary | ICD-10-CM | POA: Diagnosis not present

## 2015-11-17 DIAGNOSIS — N898 Other specified noninflammatory disorders of vagina: Secondary | ICD-10-CM | POA: Diagnosis not present

## 2015-11-17 DIAGNOSIS — E785 Hyperlipidemia, unspecified: Secondary | ICD-10-CM | POA: Diagnosis not present

## 2015-11-17 DIAGNOSIS — A6000 Herpesviral infection of urogenital system, unspecified: Secondary | ICD-10-CM | POA: Diagnosis not present

## 2015-11-17 DIAGNOSIS — Z0000 Encounter for general adult medical examination without abnormal findings: Secondary | ICD-10-CM | POA: Diagnosis not present

## 2016-01-21 DIAGNOSIS — B349 Viral infection, unspecified: Secondary | ICD-10-CM | POA: Diagnosis not present

## 2016-05-30 DIAGNOSIS — Z1389 Encounter for screening for other disorder: Secondary | ICD-10-CM | POA: Diagnosis not present

## 2016-05-30 DIAGNOSIS — Z720 Tobacco use: Secondary | ICD-10-CM | POA: Diagnosis not present

## 2016-05-30 DIAGNOSIS — Z6821 Body mass index (BMI) 21.0-21.9, adult: Secondary | ICD-10-CM | POA: Diagnosis not present

## 2016-05-30 DIAGNOSIS — R3915 Urgency of urination: Secondary | ICD-10-CM | POA: Diagnosis not present

## 2016-05-30 DIAGNOSIS — Z1231 Encounter for screening mammogram for malignant neoplasm of breast: Secondary | ICD-10-CM | POA: Diagnosis not present

## 2016-05-30 DIAGNOSIS — N898 Other specified noninflammatory disorders of vagina: Secondary | ICD-10-CM | POA: Diagnosis not present

## 2016-05-30 DIAGNOSIS — Z01419 Encounter for gynecological examination (general) (routine) without abnormal findings: Secondary | ICD-10-CM | POA: Diagnosis not present

## 2016-05-30 DIAGNOSIS — Z113 Encounter for screening for infections with a predominantly sexual mode of transmission: Secondary | ICD-10-CM | POA: Diagnosis not present

## 2016-05-30 DIAGNOSIS — A6000 Herpesviral infection of urogenital system, unspecified: Secondary | ICD-10-CM | POA: Diagnosis not present

## 2017-01-25 DIAGNOSIS — L292 Pruritus vulvae: Secondary | ICD-10-CM | POA: Diagnosis not present

## 2017-01-25 DIAGNOSIS — N898 Other specified noninflammatory disorders of vagina: Secondary | ICD-10-CM | POA: Diagnosis not present

## 2017-03-07 DIAGNOSIS — R52 Pain, unspecified: Secondary | ICD-10-CM | POA: Diagnosis not present

## 2017-03-07 DIAGNOSIS — B349 Viral infection, unspecified: Secondary | ICD-10-CM | POA: Diagnosis not present

## 2017-07-31 DIAGNOSIS — N898 Other specified noninflammatory disorders of vagina: Secondary | ICD-10-CM | POA: Diagnosis not present

## 2017-07-31 DIAGNOSIS — Z1389 Encounter for screening for other disorder: Secondary | ICD-10-CM | POA: Diagnosis not present

## 2017-07-31 DIAGNOSIS — B373 Candidiasis of vulva and vagina: Secondary | ICD-10-CM | POA: Diagnosis not present

## 2017-07-31 DIAGNOSIS — Z130 Encounter for screening for diseases of the blood and blood-forming organs and certain disorders involving the immune mechanism: Secondary | ICD-10-CM | POA: Diagnosis not present

## 2017-07-31 DIAGNOSIS — D649 Anemia, unspecified: Secondary | ICD-10-CM | POA: Diagnosis not present

## 2017-07-31 DIAGNOSIS — Z124 Encounter for screening for malignant neoplasm of cervix: Secondary | ICD-10-CM | POA: Diagnosis not present

## 2017-07-31 DIAGNOSIS — Z01419 Encounter for gynecological examination (general) (routine) without abnormal findings: Secondary | ICD-10-CM | POA: Diagnosis not present

## 2017-08-01 DIAGNOSIS — Z1151 Encounter for screening for human papillomavirus (HPV): Secondary | ICD-10-CM | POA: Diagnosis not present

## 2017-08-01 DIAGNOSIS — Z124 Encounter for screening for malignant neoplasm of cervix: Secondary | ICD-10-CM | POA: Diagnosis not present

## 2017-08-01 DIAGNOSIS — R87612 Low grade squamous intraepithelial lesion on cytologic smear of cervix (LGSIL): Secondary | ICD-10-CM | POA: Diagnosis not present

## 2017-09-21 DIAGNOSIS — N871 Moderate cervical dysplasia: Secondary | ICD-10-CM | POA: Diagnosis not present

## 2017-09-21 DIAGNOSIS — N898 Other specified noninflammatory disorders of vagina: Secondary | ICD-10-CM | POA: Diagnosis not present

## 2017-09-21 DIAGNOSIS — Z3202 Encounter for pregnancy test, result negative: Secondary | ICD-10-CM | POA: Diagnosis not present

## 2017-09-21 DIAGNOSIS — N870 Mild cervical dysplasia: Secondary | ICD-10-CM | POA: Diagnosis not present

## 2017-10-19 DIAGNOSIS — D72829 Elevated white blood cell count, unspecified: Secondary | ICD-10-CM | POA: Diagnosis not present

## 2017-10-19 DIAGNOSIS — H00016 Hordeolum externum left eye, unspecified eyelid: Secondary | ICD-10-CM | POA: Diagnosis not present

## 2017-10-19 DIAGNOSIS — J209 Acute bronchitis, unspecified: Secondary | ICD-10-CM | POA: Diagnosis not present

## 2017-10-19 DIAGNOSIS — F17210 Nicotine dependence, cigarettes, uncomplicated: Secondary | ICD-10-CM | POA: Diagnosis not present

## 2017-10-25 DIAGNOSIS — Z3202 Encounter for pregnancy test, result negative: Secondary | ICD-10-CM | POA: Diagnosis not present

## 2017-10-25 DIAGNOSIS — N870 Mild cervical dysplasia: Secondary | ICD-10-CM | POA: Diagnosis not present

## 2017-10-25 DIAGNOSIS — N879 Dysplasia of cervix uteri, unspecified: Secondary | ICD-10-CM | POA: Diagnosis not present

## 2017-12-03 DIAGNOSIS — N879 Dysplasia of cervix uteri, unspecified: Secondary | ICD-10-CM | POA: Diagnosis not present

## 2017-12-03 DIAGNOSIS — R102 Pelvic and perineal pain: Secondary | ICD-10-CM | POA: Diagnosis not present

## 2018-01-22 DIAGNOSIS — M549 Dorsalgia, unspecified: Secondary | ICD-10-CM | POA: Diagnosis not present

## 2018-01-22 DIAGNOSIS — R319 Hematuria, unspecified: Secondary | ICD-10-CM | POA: Diagnosis not present

## 2018-01-22 DIAGNOSIS — H00019 Hordeolum externum unspecified eye, unspecified eyelid: Secondary | ICD-10-CM | POA: Diagnosis not present

## 2018-01-22 DIAGNOSIS — R399 Unspecified symptoms and signs involving the genitourinary system: Secondary | ICD-10-CM | POA: Diagnosis not present

## 2018-04-04 DIAGNOSIS — J069 Acute upper respiratory infection, unspecified: Secondary | ICD-10-CM | POA: Diagnosis not present

## 2018-06-13 DIAGNOSIS — H00015 Hordeolum externum left lower eyelid: Secondary | ICD-10-CM | POA: Diagnosis not present

## 2018-09-02 DIAGNOSIS — Z1231 Encounter for screening mammogram for malignant neoplasm of breast: Secondary | ICD-10-CM | POA: Diagnosis not present

## 2018-09-02 DIAGNOSIS — Z01419 Encounter for gynecological examination (general) (routine) without abnormal findings: Secondary | ICD-10-CM | POA: Diagnosis not present

## 2018-09-02 DIAGNOSIS — R87612 Low grade squamous intraepithelial lesion on cytologic smear of cervix (LGSIL): Secondary | ICD-10-CM | POA: Diagnosis not present

## 2018-09-02 DIAGNOSIS — Z130 Encounter for screening for diseases of the blood and blood-forming organs and certain disorders involving the immune mechanism: Secondary | ICD-10-CM | POA: Diagnosis not present

## 2018-09-02 DIAGNOSIS — L293 Anogenital pruritus, unspecified: Secondary | ICD-10-CM | POA: Diagnosis not present

## 2018-09-02 DIAGNOSIS — Z8742 Personal history of other diseases of the female genital tract: Secondary | ICD-10-CM | POA: Diagnosis not present

## 2018-09-28 ENCOUNTER — Emergency Department (HOSPITAL_COMMUNITY)
Admission: EM | Admit: 2018-09-28 | Discharge: 2018-09-28 | Disposition: A | Discharge status: Home / Self Care | Payer: BC Managed Care – PPO | Attending: Emergency Medicine | Admitting: Emergency Medicine

## 2018-09-28 ENCOUNTER — Other Ambulatory Visit: Payer: Self-pay

## 2018-09-28 ENCOUNTER — Emergency Department (HOSPITAL_COMMUNITY): Payer: BC Managed Care – PPO

## 2018-09-28 DIAGNOSIS — Y929 Unspecified place or not applicable: Secondary | ICD-10-CM | POA: Insufficient documentation

## 2018-09-28 DIAGNOSIS — S299XXA Unspecified injury of thorax, initial encounter: Secondary | ICD-10-CM | POA: Diagnosis not present

## 2018-09-28 DIAGNOSIS — Y999 Unspecified external cause status: Secondary | ICD-10-CM | POA: Insufficient documentation

## 2018-09-28 DIAGNOSIS — S3991XA Unspecified injury of abdomen, initial encounter: Secondary | ICD-10-CM | POA: Diagnosis not present

## 2018-09-28 DIAGNOSIS — S0990XA Unspecified injury of head, initial encounter: Secondary | ICD-10-CM | POA: Diagnosis not present

## 2018-09-28 DIAGNOSIS — R079 Chest pain, unspecified: Secondary | ICD-10-CM | POA: Diagnosis not present

## 2018-09-28 DIAGNOSIS — Y9389 Activity, other specified: Secondary | ICD-10-CM | POA: Insufficient documentation

## 2018-09-28 DIAGNOSIS — S301XXA Contusion of abdominal wall, initial encounter: Secondary | ICD-10-CM | POA: Diagnosis not present

## 2018-09-28 DIAGNOSIS — S3993XA Unspecified injury of pelvis, initial encounter: Secondary | ICD-10-CM | POA: Diagnosis not present

## 2018-09-28 DIAGNOSIS — R58 Hemorrhage, not elsewhere classified: Secondary | ICD-10-CM | POA: Diagnosis not present

## 2018-09-28 DIAGNOSIS — R0781 Pleurodynia: Secondary | ICD-10-CM | POA: Diagnosis not present

## 2018-09-28 DIAGNOSIS — T1490XA Injury, unspecified, initial encounter: Secondary | ICD-10-CM

## 2018-09-28 DIAGNOSIS — S199XXA Unspecified injury of neck, initial encounter: Secondary | ICD-10-CM | POA: Diagnosis not present

## 2018-09-28 LAB — PROTIME-INR
INR: 0.9 (ref 0.8–1.2)
Prothrombin Time: 12.5 seconds (ref 11.4–15.2)

## 2018-09-28 LAB — I-STAT CHEM 8, ED
BUN: 12 mg/dL (ref 6–20)
Calcium, Ion: 1.1 mmol/L — ABNORMAL LOW (ref 1.15–1.40)
Chloride: 105 mmol/L (ref 98–111)
Creatinine, Ser: 0.6 mg/dL (ref 0.44–1.00)
Glucose, Bld: 103 mg/dL — ABNORMAL HIGH (ref 70–99)
HCT: 40 % (ref 36.0–46.0)
Hemoglobin: 13.6 g/dL (ref 12.0–15.0)
Potassium: 3.7 mmol/L (ref 3.5–5.1)
Sodium: 140 mmol/L (ref 135–145)
TCO2: 24 mmol/L (ref 22–32)

## 2018-09-28 LAB — CBC
HCT: 41.8 % (ref 36.0–46.0)
Hemoglobin: 13.9 g/dL (ref 12.0–15.0)
MCH: 31.4 pg (ref 26.0–34.0)
MCHC: 33.3 g/dL (ref 30.0–36.0)
MCV: 94.4 fL (ref 80.0–100.0)
Platelets: 355 10*3/uL (ref 150–400)
RBC: 4.43 MIL/uL (ref 3.87–5.11)
RDW: 12.9 % (ref 11.5–15.5)
WBC: 19.5 10*3/uL — ABNORMAL HIGH (ref 4.0–10.5)
nRBC: 0 % (ref 0.0–0.2)

## 2018-09-28 LAB — COMPREHENSIVE METABOLIC PANEL
ALT: 18 U/L (ref 0–44)
AST: 27 U/L (ref 15–41)
Albumin: 3.6 g/dL (ref 3.5–5.0)
Alkaline Phosphatase: 60 U/L (ref 38–126)
Anion gap: 10 (ref 5–15)
BUN: 13 mg/dL (ref 6–20)
CO2: 26 mmol/L (ref 22–32)
Calcium: 9 mg/dL (ref 8.9–10.3)
Chloride: 102 mmol/L (ref 98–111)
Creatinine, Ser: 0.75 mg/dL (ref 0.44–1.00)
GFR calc Af Amer: >60 mL/min (ref >60)
GFR calc non Af Amer: >60 mL/min (ref >60)
Glucose, Bld: 108 mg/dL — ABNORMAL HIGH (ref 70–99)
Potassium: 3.8 mmol/L (ref 3.5–5.1)
Sodium: 138 mmol/L (ref 135–145)
Total Bilirubin: 0.8 mg/dL (ref 0.3–1.2)
Total Protein: 6.9 g/dL (ref 6.5–8.1)

## 2018-09-28 LAB — SAMPLE TO BLOOD BANK

## 2018-09-28 LAB — CDS SEROLOGY

## 2018-09-28 LAB — URINALYSIS, ROUTINE W REFLEX MICROSCOPIC
Bilirubin Urine: NEGATIVE
Glucose, UA: NEGATIVE mg/dL
Hgb urine dipstick: NEGATIVE
Ketones, ur: NEGATIVE mg/dL
Leukocytes,Ua: NEGATIVE
Nitrite: NEGATIVE
Protein, ur: NEGATIVE mg/dL
Specific Gravity, Urine: 1.042 — ABNORMAL HIGH (ref 1.005–1.030)
pH: 5 (ref 5.0–8.0)

## 2018-09-28 LAB — I-STAT BETA HCG BLOOD, ED (MC, WL, AP ONLY): I-stat hCG, quantitative: <5 m[IU]/mL (ref <5)

## 2018-09-28 LAB — ETHANOL: Alcohol, Ethyl (B): <10 mg/dL (ref <10)

## 2018-09-28 MED ORDER — HYDROCODONE-ACETAMINOPHEN 5-325 MG PO TABS: 1.0000 | ORAL_TABLET | Freq: Four times a day (QID) | ORAL | 0 refills | Status: DC | PRN

## 2018-09-28 MED ORDER — CYCLOBENZAPRINE HCL 5 MG PO TABS: 5.0000 mg | ORAL_TABLET | Freq: Two times a day (BID) | ORAL | 0 refills | Status: DC | PRN

## 2018-09-28 MED ORDER — IOHEXOL 300 MG/ML  SOLN
100.0000 mL | Freq: Once | INTRAMUSCULAR | Status: AC | PRN
  Administered 2018-09-28: 100 mL via INTRAVENOUS

## 2018-09-28 MED ORDER — CYCLOBENZAPRINE HCL 10 MG PO TABS
5.0000 mg | ORAL_TABLET | Freq: Once | ORAL | Status: AC
  Administered 2018-09-28: 20:00:00 5 mg via ORAL
  Filled 2018-09-28: qty 1

## 2018-09-28 MED ORDER — HYDROCODONE-ACETAMINOPHEN 5-325 MG PO TABS
1.0000 | ORAL_TABLET | Freq: Once | ORAL | Status: AC
  Administered 2018-09-28: 1 via ORAL
  Filled 2018-09-28: qty 1

## 2018-09-28 MED ORDER — OXYCODONE-ACETAMINOPHEN 5-325 MG PO TABS
1.0000 | ORAL_TABLET | Freq: Once | ORAL | Status: DC
  Filled 2018-09-28: qty 1

## 2018-09-28 MED ORDER — FENTANYL CITRATE (PF) 100 MCG/2ML IJ SOLN
50.0000 ug | Freq: Once | INTRAMUSCULAR | Status: AC
  Administered 2018-09-28: 15:00:00 50 ug via INTRAVENOUS
  Filled 2018-09-28: qty 2

## 2018-09-28 MED ORDER — SODIUM CHLORIDE 0.9 % IV BOLUS
1000.0000 mL | Freq: Once | INTRAVENOUS | Status: AC
  Administered 2018-09-28: 1000 mL via INTRAVENOUS
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
## 2018-09-28 NOTE — Progress Notes (Signed)
Orthopedic Tech Progress Note Patient Details:  Erika Ford 23-Mar-1970 [id number]  Patient ID: Erika Ford, female   DOB: 06-Jul-1970, 48 y.o.   MRN: [id number]   Erika Ford 09/28/2018, 3:05 PMLevel 2 Trauma
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
## 2018-09-28 NOTE — ED Notes (Signed)
Pt verbalizes understanding of discharge instructions, prescriptions, and follow-up care. Opportunity for questions and answers provided.

## 2018-09-28 NOTE — ED Notes (Signed)
Pt able to ambulate in room without assist. Pt complained of severe pain in her left lower back

## 2018-10-07 DIAGNOSIS — S299XXD Unspecified injury of thorax, subsequent encounter: Secondary | ICD-10-CM | POA: Diagnosis not present

## 2018-10-07 DIAGNOSIS — R109 Unspecified abdominal pain: Secondary | ICD-10-CM | POA: Diagnosis not present

## 2018-10-15 DIAGNOSIS — M5442 Lumbago with sciatica, left side: Secondary | ICD-10-CM | POA: Diagnosis not present

## 2019-01-22 ENCOUNTER — Ambulatory Visit: Payer: BC Managed Care – PPO | Attending: Internal Medicine

## 2019-01-22 DIAGNOSIS — Z20822 Contact with and (suspected) exposure to covid-19: Secondary | ICD-10-CM

## 2019-01-23 LAB — NOVEL CORONAVIRUS, NAA: SARS-CoV-2, NAA: DETECTED — AB

## 2019-01-31 DIAGNOSIS — U071 COVID-19: Secondary | ICD-10-CM | POA: Diagnosis not present

## 2019-03-28 DIAGNOSIS — M25552 Pain in left hip: Secondary | ICD-10-CM | POA: Diagnosis not present

## 2019-03-28 DIAGNOSIS — A609 Anogenital herpesviral infection, unspecified: Secondary | ICD-10-CM | POA: Diagnosis not present

## 2019-03-28 DIAGNOSIS — L293 Anogenital pruritus, unspecified: Secondary | ICD-10-CM | POA: Diagnosis not present

## 2019-03-28 DIAGNOSIS — N951 Menopausal and female climacteric states: Secondary | ICD-10-CM | POA: Diagnosis not present

## 2019-03-28 DIAGNOSIS — N879 Dysplasia of cervix uteri, unspecified: Secondary | ICD-10-CM | POA: Diagnosis not present

## 2019-03-28 DIAGNOSIS — N870 Mild cervical dysplasia: Secondary | ICD-10-CM | POA: Diagnosis not present

## 2019-03-28 DIAGNOSIS — M5442 Lumbago with sciatica, left side: Secondary | ICD-10-CM | POA: Diagnosis not present

## 2019-03-28 DIAGNOSIS — Z3202 Encounter for pregnancy test, result negative: Secondary | ICD-10-CM | POA: Diagnosis not present

## 2019-04-21 DIAGNOSIS — Z23 Encounter for immunization: Secondary | ICD-10-CM | POA: Diagnosis not present

## 2019-07-02 ENCOUNTER — Ambulatory Visit: Payer: Medicaid Other | Attending: Internal Medicine

## 2019-07-02 DIAGNOSIS — Z20822 Contact with and (suspected) exposure to covid-19: Secondary | ICD-10-CM | POA: Diagnosis not present

## 2019-07-03 LAB — NOVEL CORONAVIRUS, NAA: SARS-CoV-2, NAA: NOT DETECTED

## 2019-07-03 LAB — SARS-COV-2, NAA 2 DAY TAT

## 2019-09-24 DIAGNOSIS — Z23 Encounter for immunization: Secondary | ICD-10-CM | POA: Diagnosis not present

## 2019-10-15 ENCOUNTER — Other Ambulatory Visit: Payer: Self-pay

## 2019-10-15 ENCOUNTER — Encounter: Payer: Self-pay | Admitting: Plastic Surgery

## 2019-10-15 ENCOUNTER — Ambulatory Visit (INDEPENDENT_AMBULATORY_CARE_PROVIDER_SITE_OTHER): Payer: BC Managed Care – PPO | Admitting: Plastic Surgery

## 2019-10-15 VITALS — BP 118/76 | HR 58 | Temp 97.9°F | Ht 61.0 in | Wt 115.4 lb

## 2019-10-15 DIAGNOSIS — T8544XA Capsular contracture of breast implant, initial encounter: Secondary | ICD-10-CM

## 2019-10-15 NOTE — Progress Notes (Signed)
Referring Provider Wilfrid Lund, PA [address] [place],  [place] [address]   CC:  Chief Complaint  Patient presents with  . Consult      Erika Ford is an 49 y.o. female.  HPI: Patient presents as a consult for ruptured breast implants.  This was detected on imaging.  She is also noticed that over the last few years the breast implant shape becoming more constricted and migrating superiorly.  They are painful on both sides.  She would like to have the implants removed and new ones replaced.  The implants were put in for cosmetic reasons over 30 years ago.  She believes they are silicone implants but is uncertain whether or not they are placed above or below the muscle.  They were put in through a periareolar incision.  No Known Allergies  Outpatient Encounter Medications as of 10/15/2019  Medication Sig  . traZODone (DESYREL) 100 MG tablet Take 200 mg by mouth at bedtime.   . cyclobenzaprine (FLEXERIL) 5 MG tablet Take 1 tablet (5 mg total) by mouth 2 (two) times daily as needed for muscle spasms. (Patient not taking: Reported on 10/15/2019)  . doxycycline (VIBRAMYCIN) 100 MG capsule Take 1 capsule (100 mg total) by mouth 2 (two) times daily. X 14 days (Patient not taking: Reported on 10/15/2019)  . HYDROcodone-acetaminophen (NORCO) 5-325 MG per tablet Take 1-2 tablets by mouth every 4 (four) hours as needed for pain. (Patient not taking: Reported on 10/15/2019)  . HYDROcodone-acetaminophen (NORCO/VICODIN) 5-325 MG tablet Take 1 tablet by mouth every 6 (six) hours as needed. (Patient not taking: Reported on 10/15/2019)  . ibuprofen (ADVIL,MOTRIN) 200 MG tablet Take 800 mg by mouth every 6 (six) hours as needed for pain or headache. (Patient not taking: Reported on 10/15/2019)  . metroNIDAZOLE (FLAGYL) 500 MG tablet Take 1 tablet (500 mg total) by mouth 2 (two) times daily. X 14 days (Patient not taking: Reported on 10/15/2019)  . terconazole (TERAZOL 3) 0.8 % vaginal cream Place 1  applicator vaginally at bedtime. Nightly for 3 days (Patient not taking: Reported on 10/15/2019)  . terconazole (TERAZOL 3) 80 MG vaginal suppository Place 1 suppository vaginally at bedtime. (Patient not taking: Reported on 10/15/2019)  . valACYclovir (VALTREX) 500 MG tablet Take 500 mg by mouth daily. (Patient not taking: Reported on 10/15/2019)  . valACYclovir (VALTREX) 500 MG tablet Take 500 mg by mouth daily. (Patient not taking: Reported on 10/15/2019)   No facility-administered encounter medications on file as of 10/15/2019.     Past Medical History:  Diagnosis Date  . GERD (gastroesophageal reflux disease)    no meds  . Headache(784.0)    h/o migraines  . Herpes   . SVD (spontaneous vaginal delivery)    x 3  . UTI (lower urinary tract infection)    on abx    Past Surgical History:  Procedure Laterality Date  . BREAST SURGERY     breast implants  . CESAREAN SECTION    . LAPAROSCOPY N/A 08/02/2012   Procedure: LAPAROSCOPY DIAGNOSTIC POSSIBLE OPERATIVE;  Surgeon: Lavina Hamman, MD;  Location: [facility];  Service: Gynecology;  Laterality: N/A;  . TUBAL LIGATION    . URETHRAL STRICTURE DILATATION    . WISDOM TOOTH EXTRACTION      No family history on file.  Social History   Social History Narrative  . Not on file  Denies tobacco use  Review of Systems General: Denies fevers, chills, weight loss CV: Denies  chest pain, shortness of breath, palpitations  Physical Exam Vitals with BMI 10/15/2019 09/28/2018 09/28/2018  Height 5\' 1"  - -  Weight 115 lbs 6 oz - -  BMI 21.82 - -  Systolic 118 111  Diastolic 76 72 67  Pulse 58 75 73    General:  No acute distress,  Alert and oriented, Non-Toxic, Normal speech and affect Breast: She has bilateral capsular contracture around her implants.  I classified as grade 4 given that they are tender.  She has well-healed periareolar incisions on both sides.  Is difficult to assess the volume of the implants that she has in place.  They  do have a constricted appearance consistent with capsular contracture.  Her base width is 11.5 cm  Assessment/Plan Patient presents with bilateral capsular contracture and imaging suggestive of intracapsular silicone implant rupture on both sides.  We discussed removal of implants with total capsulectomy and replacement of new silicone gel implants.  We discussed the risk of the procedure that include bleeding, infection, damage to surrounding structures and need for additional procedures.  We discussed the potential for recurrent capsular contracture.  We discussed the need for drains postoperatively given the total capsulectomy portion of the procedure.  She asked about size and initially wanted to be a little bit smaller.  I explained if she wanted to go down in size she would probably need a concurrent mastopexy and she did not want that.  I explained that in order to fill out the volume the best scenario would probably be to use a slightly bigger implant and she was okay with that as long as it looked appropriate.  Ultimately I think she did not do pretty close to the same size that she is now but just with a much nicer shape.  We did discuss the pros and cons of silicone and saline implants and she prefers silicone gel.  937 10/15/2019, 12:48 PM

## 2019-10-27 ENCOUNTER — Telehealth: Payer: Self-pay | Admitting: Plastic Surgery

## 2019-10-27 NOTE — Telephone Encounter (Signed)
Left message on patient's voicemail advising of insurance decision from surgery authorization request - advised patient to call me back when she has a moment to discuss. The surgery request has been denied by insurance as not medically necessary, which we anticipated. The surgery quote has been provided via MyChart, when the patient is ready to proceed with scheduling as self-pay.

## 2019-12-15 DIAGNOSIS — Z20822 Contact with and (suspected) exposure to covid-19: Secondary | ICD-10-CM | POA: Diagnosis not present

## 2019-12-19 DIAGNOSIS — K0889 Other specified disorders of teeth and supporting structures: Secondary | ICD-10-CM | POA: Diagnosis not present

## 2020-01-19 DIAGNOSIS — Z1152 Encounter for screening for COVID-19: Secondary | ICD-10-CM | POA: Diagnosis not present

## 2020-02-12 DIAGNOSIS — R87619 Unspecified abnormal cytological findings in specimens from cervix uteri: Secondary | ICD-10-CM | POA: Diagnosis not present

## 2020-02-12 DIAGNOSIS — R87613 High grade squamous intraepithelial lesion on cytologic smear of cervix (HGSIL): Secondary | ICD-10-CM | POA: Diagnosis not present

## 2020-02-12 DIAGNOSIS — N898 Other specified noninflammatory disorders of vagina: Secondary | ICD-10-CM | POA: Diagnosis not present

## 2020-02-12 DIAGNOSIS — Z130 Encounter for screening for diseases of the blood and blood-forming organs and certain disorders involving the immune mechanism: Secondary | ICD-10-CM | POA: Diagnosis not present

## 2020-02-12 DIAGNOSIS — Z01419 Encounter for gynecological examination (general) (routine) without abnormal findings: Secondary | ICD-10-CM | POA: Diagnosis not present

## 2020-02-12 DIAGNOSIS — Z6821 Body mass index (BMI) 21.0-21.9, adult: Secondary | ICD-10-CM | POA: Diagnosis not present

## 2020-03-03 DIAGNOSIS — A6000 Herpesviral infection of urogenital system, unspecified: Secondary | ICD-10-CM | POA: Diagnosis not present

## 2020-03-03 DIAGNOSIS — G4700 Insomnia, unspecified: Secondary | ICD-10-CM | POA: Diagnosis not present

## 2020-03-03 DIAGNOSIS — M545 Low back pain, unspecified: Secondary | ICD-10-CM | POA: Diagnosis not present

## 2020-03-03 DIAGNOSIS — Z0000 Encounter for general adult medical examination without abnormal findings: Secondary | ICD-10-CM | POA: Diagnosis not present

## 2020-03-03 DIAGNOSIS — E785 Hyperlipidemia, unspecified: Secondary | ICD-10-CM | POA: Diagnosis not present

## 2020-03-11 DIAGNOSIS — N951 Menopausal and female climacteric states: Secondary | ICD-10-CM | POA: Diagnosis not present

## 2020-03-11 DIAGNOSIS — N879 Dysplasia of cervix uteri, unspecified: Secondary | ICD-10-CM | POA: Diagnosis not present

## 2020-03-12 DIAGNOSIS — N870 Mild cervical dysplasia: Secondary | ICD-10-CM | POA: Diagnosis not present

## 2020-03-12 DIAGNOSIS — N871 Moderate cervical dysplasia: Secondary | ICD-10-CM | POA: Diagnosis not present

## 2020-04-08 DIAGNOSIS — N871 Moderate cervical dysplasia: Secondary | ICD-10-CM | POA: Diagnosis not present

## 2020-04-08 DIAGNOSIS — N879 Dysplasia of cervix uteri, unspecified: Secondary | ICD-10-CM | POA: Diagnosis not present

## 2020-05-12 DIAGNOSIS — M545 Low back pain, unspecified: Secondary | ICD-10-CM | POA: Diagnosis not present
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
## 2020-12-15 ENCOUNTER — Ambulatory Visit: Payer: BC Managed Care – PPO | Admitting: Plastic Surgery

## 2021-06-20 DIAGNOSIS — N898 Other specified noninflammatory disorders of vagina: Secondary | ICD-10-CM | POA: Diagnosis not present

## 2021-06-20 DIAGNOSIS — D72829 Elevated white blood cell count, unspecified: Secondary | ICD-10-CM | POA: Diagnosis not present

## 2021-06-20 DIAGNOSIS — R102 Pelvic and perineal pain: Secondary | ICD-10-CM | POA: Diagnosis not present

## 2021-06-20 DIAGNOSIS — E785 Hyperlipidemia, unspecified: Secondary | ICD-10-CM | POA: Diagnosis not present

## 2021-06-30 DIAGNOSIS — R87810 Cervical high risk human papillomavirus (HPV) DNA test positive: Secondary | ICD-10-CM | POA: Diagnosis not present

## 2021-06-30 DIAGNOSIS — Z1389 Encounter for screening for other disorder: Secondary | ICD-10-CM | POA: Diagnosis not present

## 2021-06-30 DIAGNOSIS — R87613 High grade squamous intraepithelial lesion on cytologic smear of cervix (HGSIL): Secondary | ICD-10-CM | POA: Diagnosis not present

## 2021-06-30 DIAGNOSIS — A609 Anogenital herpesviral infection, unspecified: Secondary | ICD-10-CM | POA: Diagnosis not present

## 2021-06-30 DIAGNOSIS — Z01419 Encounter for gynecological examination (general) (routine) without abnormal findings: Secondary | ICD-10-CM | POA: Diagnosis not present

## 2021-06-30 DIAGNOSIS — Z8742 Personal history of other diseases of the female genital tract: Secondary | ICD-10-CM | POA: Diagnosis not present

## 2021-07-26 DIAGNOSIS — E785 Hyperlipidemia, unspecified: Secondary | ICD-10-CM | POA: Diagnosis not present

## 2021-07-26 DIAGNOSIS — M545 Low back pain, unspecified: Secondary | ICD-10-CM | POA: Diagnosis not present

## 2021-07-26 DIAGNOSIS — Z0000 Encounter for general adult medical examination without abnormal findings: Secondary | ICD-10-CM | POA: Diagnosis not present

## 2021-07-26 DIAGNOSIS — G4700 Insomnia, unspecified: Secondary | ICD-10-CM | POA: Diagnosis not present

## 2021-08-01 DIAGNOSIS — Z1211 Encounter for screening for malignant neoplasm of colon: Secondary | ICD-10-CM | POA: Diagnosis not present

## 2021-08-08 DIAGNOSIS — R87613 High grade squamous intraepithelial lesion on cytologic smear of cervix (HGSIL): Secondary | ICD-10-CM | POA: Diagnosis not present

## 2021-08-08 DIAGNOSIS — N879 Dysplasia of cervix uteri, unspecified: Secondary | ICD-10-CM | POA: Diagnosis not present

## 2021-08-08 DIAGNOSIS — R87810 Cervical high risk human papillomavirus (HPV) DNA test positive: Secondary | ICD-10-CM | POA: Diagnosis not present

## 2021-08-15 DIAGNOSIS — D72829 Elevated white blood cell count, unspecified: Secondary | ICD-10-CM | POA: Diagnosis not present

## 2021-11-11 DIAGNOSIS — R87810 Cervical high risk human papillomavirus (HPV) DNA test positive: Secondary | ICD-10-CM | POA: Diagnosis not present

## 2021-11-11 DIAGNOSIS — N898 Other specified noninflammatory disorders of vagina: Secondary | ICD-10-CM | POA: Diagnosis not present

## 2021-11-11 DIAGNOSIS — N879 Dysplasia of cervix uteri, unspecified: Secondary | ICD-10-CM | POA: Diagnosis not present

## 2022-01-17 DIAGNOSIS — Z01818 Encounter for other preprocedural examination: Secondary | ICD-10-CM | POA: Diagnosis not present

## 2022-01-27 ENCOUNTER — Encounter (HOSPITAL_BASED_OUTPATIENT_CLINIC_OR_DEPARTMENT_OTHER): Payer: Self-pay | Admitting: Obstetrics and Gynecology

## 2022-01-27 ENCOUNTER — Other Ambulatory Visit: Payer: Self-pay

## 2022-01-27 DIAGNOSIS — Z01818 Encounter for other preprocedural examination: Secondary | ICD-10-CM | POA: Diagnosis not present

## 2022-01-27 NOTE — Progress Notes (Signed)
Spoke w/ via phone for pre-op interview---[facility] needs dos---- urine pregnancy              Lab results------02/03/22 lab appt for cbc, cmp, type & screen COVID test -----patient states asymptomatic no test needed Arrive at -------0745 on Tuesday, 02/07/22 NPO after MN NO Solid Food.  Clear liquids from MN until---0645 Med rec completed Medications to take morning of surgery -----Valtrex prn Diabetic medication -----n/a Patient instructed no nail polish to be worn day of surgery Patient instructed to bring photo id and insurance card day of surgery Patient aware to have Driver (ride ) / caregiver    for 24 hours after surgery - son, Yong Channel Patient Special Instructions -----Extended / overnight stay instructions given. Pre-Op special Istructions -----none Patient verbalized understanding of instructions that were given at this phone interview. Patient denies shortness of breath, chest pain, fever, cough at this phone interview.

## 2022-01-27 NOTE — Progress Notes (Signed)
Your procedure is scheduled on Tuesday, 02/07/2022.  Report to [facility].   Call this number if you have problems the morning of surgery  :[phone].   OUR ADDRESS IS [place].  WE ARE LOCATED IN [facility].  PLEASE BRING YOUR INSURANCE CARD AND PHOTO ID DAY OF SURGERY.  ONLY 2 PEOPLE ARE ALLOWED IN  WAITING  ROOM.                                      REMEMBER:  DO NOT EAT FOOD, CANDY GUM OR MINTS  AFTER MIDNIGHT THE NIGHT BEFORE YOUR SURGERY . YOU MAY HAVE CLEAR LIQUIDS FROM MIDNIGHT THE NIGHT BEFORE YOUR SURGERY UNTIL  6:45 AM. NO CLEAR LIQUIDS AFTER   6:45 AM DAY OF SURGERY.  YOU MAY  BRUSH YOUR TEETH MORNING OF SURGERY AND RINSE YOUR MOUTH OUT, NO CHEWING GUM CANDY OR MINTS.     CLEAR LIQUID DIET   Foods Allowed                                                                     Foods Excluded  Coffee and tea, regular and decaf                             liquids that you cannot  Plain Jell-O                                                                   see through such as: Fruit ices (not with fruit pulp)                                     milk, soups, orange juice  Plain  Popsicles                                    All solid food Carbonated beverages, regular and diet                                    Cranberry, grape and apple juices Sports drinks like Gatorade _____________________________________________________________________     TAKE ONLY THESE MEDICATIONS MORNING OF SURGERY: Valtrex if needed    UP TO 4 VISITORS  MAY VISIT IN THE EXTENDED RECOVERY ROOM UNTIL 800 PM ONLY.  ONE  VISITOR AGE 80 AND OVER MAY SPEND THE NIGHT AND MUST BE IN EXTENDED RECOVERY ROOM NO LATER THAN 800 PM . YOUR DISCHARGE TIME AFTER YOU SPEND THE NIGHT IS 900 AM THE MORNING AFTER YOUR SURGERY.  YOU MAY PACK A SMALL OVERNIGHT BAG WITH TOILETRIES FOR YOUR  OVERNIGHT STAY IF YOU WISH.  YOUR PRESCRIPTION MEDICATIONS  WILL BE PROVIDED DURING [facility].                                      DO NOT WEAR JEWERLY, MAKE UP. DO NOT WEAR LOTIONS, POWDERS, PERFUMES OR NAIL POLISH ON YOUR FINGERNAILS. TOENAIL POLISH IS OK TO WEAR. DO NOT SHAVE FOR 48 HOURS PRIOR TO DAY OF SURGERY. MEN MAY SHAVE FACE AND NECK. CONTACTS, GLASSES, OR DENTURES MAY NOT BE WORN TO SURGERY.  REMEMBER: NO SMOKING, DRUGS OR ALCOHOL FOR 24 HOURS BEFORE YOUR SURGERY.                                    [facility] IS NOT RESPONSIBLE  FOR ANY BELONGINGS.                                                                    Marland Kitchen           [facility] - Preparing for Surgery Before surgery, you can play an important role.  Because skin is not sterile, your skin needs to be as free of germs as possible.  You can reduce the number of germs on your skin by washing with CHG (chlorahexidine gluconate) soap before surgery.  CHG is an antiseptic cleaner which kills germs and bonds with the skin to continue killing germs even after washing. Please DO NOT use if you have an allergy to CHG or antibacterial soaps.  If your skin becomes reddened/irritated stop using the CHG and inform your nurse when you arrive at Short Stay. Do not shave (including legs and underarms) for at least 48 hours prior to the first CHG shower.  You may shave your face/neck. Please follow these instructions carefully:  1.  Shower with CHG Soap the night before surgery and the  morning of Surgery.  2.  If you choose to wash your hair, wash your hair first as usual with your  normal  shampoo.  3.  After you shampoo, rinse your hair and body thoroughly to remove the  shampoo.                                        4.  Use CHG as you would any other liquid soap.  You can apply chg directly  to the skin and wash , chg soap provided, night before and morning of your surgery.  5.  Apply the CHG Soap to your body ONLY FROM THE NECK DOWN.   Do not use on face/ open                            Wound or open sores. Avoid contact with eyes, ears mouth and genitals (private parts).                       Wash face,  Genitals (private parts) with your normal soap.  6.  Wash thoroughly, paying special attention to the area where your surgery  will be performed.  7.  Thoroughly rinse your body with warm water from the neck down.  8.  DO NOT shower/wash with your normal soap after using and rinsing off  the CHG Soap.             9.  Pat yourself dry with a clean towel.            10.  Wear clean pajamas.            11.  Place clean sheets on your bed the night of your first shower and do not  sleep with pets. Day of Surgery : Do not apply any lotions/deodorants the morning of surgery.  Please wear clean clothes to the hospital/surgery center.  IF YOU HAVE ANY SKIN IRRITATION OR PROBLEMS WITH THE SURGICAL SOAP, PLEASE GET A BAR OF GOLD DIAL SOAP AND SHOWER THE NIGHT BEFORE YOUR SURGERY AND THE MORNING OF YOUR SURGERY. PLEASE LET THE NURSE KNOW MORNING OF YOUR SURGERY IF YOU HAD ANY PROBLEMS WITH THE SURGICAL SOAP.   ________________________________________________________________________                                                        QUESTIONS [facility] PRE OP NURSE PHONE [phone].

## 2022-02-03 ENCOUNTER — Encounter (HOSPITAL_COMMUNITY)
Admission: RE | Admit: 2022-02-03 | Discharge: 2022-02-03 | Disposition: A | Discharge status: Home / Self Care | Payer: BC Managed Care – PPO | Source: Ambulatory Visit | Attending: Obstetrics and Gynecology | Admitting: Obstetrics and Gynecology

## 2022-02-03 DIAGNOSIS — Z01818 Encounter for other preprocedural examination: Secondary | ICD-10-CM | POA: Insufficient documentation

## 2022-02-03 LAB — COMPREHENSIVE METABOLIC PANEL
ALT: 14 U/L (ref 0–44)
AST: 21 U/L (ref 15–41)
Albumin: 3.8 g/dL (ref 3.5–5.0)
Alkaline Phosphatase: 54 U/L (ref 38–126)
Anion gap: 9 (ref 5–15)
BUN: 19 mg/dL (ref 6–20)
CO2: 28 mmol/L (ref 22–32)
Calcium: 9.2 mg/dL (ref 8.9–10.3)
Chloride: 103 mmol/L (ref 98–111)
Creatinine, Ser: 0.71 mg/dL (ref 0.44–1.00)
GFR, Estimated: >60 mL/min (ref >60)
Glucose, Bld: 118 mg/dL — ABNORMAL HIGH (ref 70–99)
Potassium: 3.5 mmol/L (ref 3.5–5.1)
Sodium: 140 mmol/L (ref 135–145)
Total Bilirubin: 0.9 mg/dL (ref 0.3–1.2)
Total Protein: 7.5 g/dL (ref 6.5–8.1)

## 2022-02-03 LAB — CBC
HCT: 39 % (ref 36.0–46.0)
Hemoglobin: 13.1 g/dL (ref 12.0–15.0)
MCH: 29.8 pg (ref 26.0–34.0)
MCHC: 33.6 g/dL (ref 30.0–36.0)
MCV: 88.6 fL (ref 80.0–100.0)
Platelets: 355 10*3/uL (ref 150–400)
RBC: 4.4 MIL/uL (ref 3.87–5.11)
RDW: 12.9 % (ref 11.5–15.5)
WBC: 9.1 10*3/uL (ref 4.0–10.5)
nRBC: 0 % (ref 0.0–0.2)

## 2022-02-05 NOTE — H&P (Signed)
Erika Ford is an 52 y.o. female [phone] with recurrent cervical dysplasia, s/p LEEP x 3 for robot assisted total laparoscopic hysterectomy/B salpingectomy/possible cystectomy.  Recent LEEP CIN 3 with positve margins.  Patient is menopausal.D/W pt r/b/a of surgery, also process and expectations, wishes to proceed.    Pertinent Gynecological History: [id number] SVD x 3, LTCS x 1 with BTL LMP 01/2018 Last MMG 09/02/18 - breast implants are leaking. Recent LEEP - CIN3, + endocervical margins, s/p LEEP x 2 H/o Chl and HSV  Menstrual History:  Patient's last menstrual period was 06/23/2012.    Past Medical History:  Diagnosis Date   Arthritis    fingers   Complication of anesthesia    COVID-19    x 2, no hospitalizations, infusions, or anti-virals   GERD (gastroesophageal reflux disease)    no meds   Headache(784.0)    remote h/o migraines   Herpes    History of abnormal cervical Pap smear    hpv +   PONV (postoperative nausea and vomiting)    SVD (spontaneous vaginal delivery)    x 3   UTI (lower urinary tract infection)    hx of    Past Surgical History:  Procedure Laterality Date   BREAST SURGERY     breast implants   CESAREAN SECTION  2000   LAPAROSCOPY N/A 08/02/2012   Procedure: LAPAROSCOPY DIAGNOSTIC POSSIBLE OPERATIVE;  Surgeon: Cheri Fowler, MD;  Location: [facility];  Service: Gynecology;  Laterality: N/A;   TUBAL LIGATION     URETHRAL STRICTURE DILATATION     as a child   WISDOM TOOTH EXTRACTION      FH: lung CA, DM, HTN, dementia  Social History:  reports that she quit smoking about 4 years ago. Her smoking use included cigarettes. She has a 15.00 pack-year smoking history. She has never used smokeless tobacco. She reports that she does not currently use alcohol. She reports that she does not use drugs. Divorced, works in housekeeping  Allergies: No Known Allergies  Meds: Ibuprofen, trazadone, valtrex prn  Review of Systems  Constitutional: Negative.    Respiratory: Negative.    Cardiovascular: Negative.   Gastrointestinal: Negative.   Genitourinary: Negative.   Musculoskeletal: Negative.   Skin: Negative.   Neurological: Negative.   Psychiatric/Behavioral: Negative.      Height 5\' 1"  (1.549 m), weight 52.2 kg, last menstrual period 06/23/2012. Physical Exam Constitutional:      Appearance: Normal appearance. She is normal weight.  HENT:     Head: Normocephalic and atraumatic.  Cardiovascular:     Rate and Rhythm: Normal rate and regular rhythm.  Pulmonary:     Effort: Pulmonary effort is normal.     Breath sounds: Normal breath sounds.  Abdominal:     General: Bowel sounds are normal.     Palpations: Abdomen is soft.  Genitourinary:    General: Normal vulva.     Rectum: Normal.  Musculoskeletal:        General: Normal range of motion.     Cervical back: Normal range of motion and neck supple.  Skin:    General: Skin is warm and dry.  Neurological:     General: No focal deficit present.     Mental Status: She is alert and oriented to person, place, and time.  Psychiatric:        Mood and Affect: Mood normal.        Behavior: Behavior normal.      Assessment/Plan: 51yo [id number] with cervical  dysplasia for RA TLH/BS/possible cystoscopy D/w pt r/b/a, process and expectations of surgery Ancef for prophylaxis Will proceed  Shelah Heatley Bovard-Stuckert 02/05/2022, 7:32 PM

## 2022-02-06 NOTE — Progress Notes (Signed)
Pt was aware of time change to come in at 0530 instead of 0745. Clears until 0430

## 2022-02-07 ENCOUNTER — Other Ambulatory Visit: Payer: Self-pay

## 2022-02-07 ENCOUNTER — Encounter (HOSPITAL_BASED_OUTPATIENT_CLINIC_OR_DEPARTMENT_OTHER)
Admission: RE | Disposition: A | Discharge status: Home / Self Care | Payer: Self-pay | Source: Ambulatory Visit | Attending: Obstetrics and Gynecology

## 2022-02-07 ENCOUNTER — Observation Stay (HOSPITAL_BASED_OUTPATIENT_CLINIC_OR_DEPARTMENT_OTHER)
Admission: RE | Admit: 2022-02-07 | Discharge: 2022-02-07 | Disposition: A | Discharge status: Home / Self Care | Payer: BC Managed Care – PPO | Source: Ambulatory Visit | Attending: Obstetrics and Gynecology | Admitting: Obstetrics and Gynecology

## 2022-02-07 ENCOUNTER — Ambulatory Visit (HOSPITAL_BASED_OUTPATIENT_CLINIC_OR_DEPARTMENT_OTHER): Payer: BC Managed Care – PPO | Admitting: Anesthesiology

## 2022-02-07 ENCOUNTER — Encounter (HOSPITAL_BASED_OUTPATIENT_CLINIC_OR_DEPARTMENT_OTHER): Payer: Self-pay | Admitting: Obstetrics and Gynecology

## 2022-02-07 DIAGNOSIS — N879 Dysplasia of cervix uteri, unspecified: Secondary | ICD-10-CM | POA: Diagnosis not present

## 2022-02-07 DIAGNOSIS — Z8616 Personal history of COVID-19: Secondary | ICD-10-CM | POA: Diagnosis not present

## 2022-02-07 DIAGNOSIS — Z87891 Personal history of nicotine dependence: Secondary | ICD-10-CM | POA: Insufficient documentation

## 2022-02-07 DIAGNOSIS — Z90710 Acquired absence of both cervix and uterus: Secondary | ICD-10-CM | POA: Diagnosis present

## 2022-02-07 DIAGNOSIS — Z01818 Encounter for other preprocedural examination: Secondary | ICD-10-CM

## 2022-02-07 DIAGNOSIS — Z9889 Other specified postprocedural states: Secondary | ICD-10-CM

## 2022-02-07 HISTORY — PX: ROBOTIC ASSISTED LAPAROSCOPIC HYSTERECTOMY AND SALPINGECTOMY: SHX6379

## 2022-02-07 HISTORY — DX: Other specified postprocedural states: Z98.890

## 2022-02-07 HISTORY — DX: Nausea with vomiting, unspecified: R11.2

## 2022-02-07 HISTORY — DX: Unspecified osteoarthritis, unspecified site: M19.90

## 2022-02-07 HISTORY — DX: Personal history of other diseases of the female genital tract: Z87.42

## 2022-02-07 HISTORY — DX: Other complications of anesthesia, initial encounter: T88.59XA

## 2022-02-07 HISTORY — DX: COVID-19: U07.1

## 2022-02-07 LAB — TYPE AND SCREEN
ABO/RH(D): O NEG
Antibody Screen: NEGATIVE

## 2022-02-07 LAB — CBC
HCT: 39.4 % (ref 36.0–46.0)
Hemoglobin: 13 g/dL (ref 12.0–15.0)
MCH: 29.7 pg (ref 26.0–34.0)
MCHC: 33 g/dL (ref 30.0–36.0)
MCV: 90.2 fL (ref 80.0–100.0)
Platelets: 299 10*3/uL (ref 150–400)
RBC: 4.37 MIL/uL (ref 3.87–5.11)
RDW: 13 % (ref 11.5–15.5)
WBC: 16 10*3/uL — ABNORMAL HIGH (ref 4.0–10.5)
nRBC: 0 % (ref 0.0–0.2)

## 2022-02-07 LAB — BASIC METABOLIC PANEL
Anion gap: 8 (ref 5–15)
BUN: 12 mg/dL (ref 6–20)
CO2: 27 mmol/L (ref 22–32)
Calcium: 8.5 mg/dL — ABNORMAL LOW (ref 8.9–10.3)
Chloride: 102 mmol/L (ref 98–111)
Creatinine, Ser: 0.63 mg/dL (ref 0.44–1.00)
GFR, Estimated: >60 mL/min (ref >60)
Glucose, Bld: 131 mg/dL — ABNORMAL HIGH (ref 70–99)
Potassium: 3.7 mmol/L (ref 3.5–5.1)
Sodium: 137 mmol/L (ref 135–145)

## 2022-02-07 LAB — ABO/RH: ABO/RH(D): O NEG

## 2022-02-07 SURGERY — XI ROBOTIC ASSISTED LAPAROSCOPIC HYSTERECTOMY AND SALPINGECTOMY
Anesthesia: General | Laterality: Bilateral

## 2022-02-07 MED ORDER — ROCURONIUM BROMIDE 10 MG/ML (PF) SYRINGE
PREFILLED_SYRINGE | INTRAVENOUS | Status: AC
  Filled 2022-02-07: qty 10

## 2022-02-07 MED ORDER — OXYCODONE-ACETAMINOPHEN 5-325 MG PO TABS: 1.0000 | ORAL_TABLET | Freq: Four times a day (QID) | ORAL | 0 refills | Status: AC | PRN

## 2022-02-07 MED ORDER — FENTANYL CITRATE (PF) 100 MCG/2ML IJ SOLN
INTRAMUSCULAR | Status: DC | PRN
  Administered 2022-02-07 (×5): 50 ug via INTRAVENOUS

## 2022-02-07 MED ORDER — HYDROMORPHONE HCL 1 MG/ML IJ SOLN
0.2500 mg | INTRAMUSCULAR | Status: DC | PRN
  Administered 2022-02-07 (×2): 0.5 mg via INTRAVENOUS

## 2022-02-07 MED ORDER — LIDOCAINE HCL (CARDIAC) PF 100 MG/5ML IV SOSY
PREFILLED_SYRINGE | INTRAVENOUS | Status: DC | PRN
  Administered 2022-02-07: 60 mg via INTRAVENOUS

## 2022-02-07 MED ORDER — MENTHOL 3 MG MT LOZG: 1.0000 | LOZENGE | OROMUCOSAL | Status: DC | PRN

## 2022-02-07 MED ORDER — DEXAMETHASONE SODIUM PHOSPHATE 10 MG/ML IJ SOLN
INTRAMUSCULAR | Status: AC
  Filled 2022-02-07: qty 1

## 2022-02-07 MED ORDER — OXYCODONE-ACETAMINOPHEN 5-325 MG PO TABS
ORAL_TABLET | ORAL | Status: AC
  Filled 2022-02-07: qty 1

## 2022-02-07 MED ORDER — DIPHENHYDRAMINE HCL 50 MG/ML IJ SOLN: 12.5000 mg | Freq: Four times a day (QID) | INTRAMUSCULAR | Status: DC | PRN

## 2022-02-07 MED ORDER — IBUPROFEN 800 MG PO TABS
800.0000 mg | ORAL_TABLET | Freq: Three times a day (TID) | ORAL | Status: DC | PRN
  Administered 2022-02-07: 800 mg via ORAL

## 2022-02-07 MED ORDER — ONDANSETRON HCL 4 MG/2ML IJ SOLN: 4.0000 mg | Freq: Once | INTRAMUSCULAR | Status: DC | PRN

## 2022-02-07 MED ORDER — MIDAZOLAM HCL 5 MG/5ML IJ SOLN
INTRAMUSCULAR | Status: DC | PRN
  Administered 2022-02-07: 2 mg via INTRAVENOUS

## 2022-02-07 MED ORDER — OXYCODONE HCL 5 MG/5ML PO SOLN: 5.0000 mg | Freq: Once | ORAL | Status: AC | PRN

## 2022-02-07 MED ORDER — SIMETHICONE 80 MG PO CHEW: 80.0000 mg | CHEWABLE_TABLET | Freq: Four times a day (QID) | ORAL | Status: DC | PRN

## 2022-02-07 MED ORDER — KETAMINE HCL 50 MG/5ML IJ SOSY
PREFILLED_SYRINGE | INTRAMUSCULAR | Status: AC
  Filled 2022-02-07: qty 5

## 2022-02-07 MED ORDER — HYDROMORPHONE HCL 1 MG/ML IJ SOLN
INTRAMUSCULAR | Status: AC
  Filled 2022-02-07: qty 1

## 2022-02-07 MED ORDER — HYDROMORPHONE HCL 1 MG/ML IJ SOLN
0.2000 mg | INTRAMUSCULAR | Status: DC | PRN
  Administered 2022-02-07: 0.6 mg via INTRAVENOUS

## 2022-02-07 MED ORDER — OXYCODONE HCL 5 MG PO TABS
5.0000 mg | ORAL_TABLET | Freq: Once | ORAL | Status: AC | PRN
  Administered 2022-02-07: 5 mg via ORAL

## 2022-02-07 MED ORDER — ONDANSETRON HCL 4 MG PO TABS: 4.0000 mg | ORAL_TABLET | Freq: Four times a day (QID) | ORAL | Status: DC | PRN

## 2022-02-07 MED ORDER — OXYCODONE HCL 5 MG PO TABS
ORAL_TABLET | ORAL | Status: AC
  Filled 2022-02-07: qty 1

## 2022-02-07 MED ORDER — PHENYLEPHRINE 80 MCG/ML (10ML) SYRINGE FOR IV PUSH (FOR BLOOD PRESSURE SUPPORT)
PREFILLED_SYRINGE | INTRAVENOUS | Status: AC
  Filled 2022-02-07: qty 10

## 2022-02-07 MED ORDER — ONDANSETRON HCL 4 MG/2ML IJ SOLN
4.0000 mg | Freq: Four times a day (QID) | INTRAMUSCULAR | Status: DC | PRN
  Administered 2022-02-07 (×2): 4 mg via INTRAVENOUS

## 2022-02-07 MED ORDER — MIDAZOLAM HCL 2 MG/2ML IJ SOLN
INTRAMUSCULAR | Status: AC
  Filled 2022-02-07: qty 2

## 2022-02-07 MED ORDER — PHENYLEPHRINE HCL (PRESSORS) 10 MG/ML IV SOLN: INTRAVENOUS | Status: DC | PRN

## 2022-02-07 MED ORDER — ONDANSETRON HCL 4 MG/2ML IJ SOLN
INTRAMUSCULAR | Status: DC | PRN
  Administered 2022-02-07: 4 mg via INTRAVENOUS

## 2022-02-07 MED ORDER — GABAPENTIN 300 MG PO CAPS
300.0000 mg | ORAL_CAPSULE | ORAL | Status: AC
  Administered 2022-02-07: 300 mg via ORAL

## 2022-02-07 MED ORDER — FENTANYL CITRATE (PF) 250 MCG/5ML IJ SOLN
INTRAMUSCULAR | Status: AC
  Filled 2022-02-07: qty 5

## 2022-02-07 MED ORDER — ROCURONIUM BROMIDE 100 MG/10ML IV SOLN
INTRAVENOUS | Status: DC | PRN
  Administered 2022-02-07 (×2): 10 mg via INTRAVENOUS
  Administered 2022-02-07: 80 mg via INTRAVENOUS

## 2022-02-07 MED ORDER — SUGAMMADEX SODIUM 200 MG/2ML IV SOLN
INTRAVENOUS | Status: DC | PRN
  Administered 2022-02-07: 200 mg via INTRAVENOUS

## 2022-02-07 MED ORDER — IBUPROFEN 800 MG PO TABS
ORAL_TABLET | ORAL | Status: AC
  Filled 2022-02-07: qty 1

## 2022-02-07 MED ORDER — KETOROLAC TROMETHAMINE 30 MG/ML IJ SOLN
INTRAMUSCULAR | Status: DC | PRN
  Administered 2022-02-07: 15 mg via INTRAVENOUS

## 2022-02-07 MED ORDER — KETOROLAC TROMETHAMINE 30 MG/ML IJ SOLN: 30.0000 mg | Freq: Once | INTRAMUSCULAR | Status: DC | PRN

## 2022-02-07 MED ORDER — NALOXONE HCL 0.4 MG/ML IJ SOLN: 0.4000 mg | INTRAMUSCULAR | Status: DC | PRN

## 2022-02-07 MED ORDER — LACTATED RINGERS IV SOLN: INTRAVENOUS | Status: DC

## 2022-02-07 MED ORDER — ONDANSETRON HCL 4 MG/2ML IJ SOLN
INTRAMUSCULAR | Status: AC
  Filled 2022-02-07: qty 2

## 2022-02-07 MED ORDER — HYDROMORPHONE 1 MG/ML IV SOLN: INTRAVENOUS | Status: DC

## 2022-02-07 MED ORDER — PROMETHAZINE HCL 12.5 MG PO TABS: 12.5000 mg | ORAL_TABLET | Freq: Four times a day (QID) | ORAL | 0 refills | Status: AC | PRN

## 2022-02-07 MED ORDER — SODIUM CHLORIDE 0.9 % IR SOLN
Status: DC | PRN
  Administered 2022-02-07: 1000 mL

## 2022-02-07 MED ORDER — PROPOFOL 10 MG/ML IV BOLUS
INTRAVENOUS | Status: AC
  Filled 2022-02-07: qty 20

## 2022-02-07 MED ORDER — ACETAMINOPHEN 500 MG PO TABS
1000.0000 mg | ORAL_TABLET | ORAL | Status: AC
  Administered 2022-02-07: 1000 mg via ORAL

## 2022-02-07 MED ORDER — AMISULPRIDE (ANTIEMETIC) 5 MG/2ML IV SOLN: 10.0000 mg | Freq: Once | INTRAVENOUS | Status: DC | PRN

## 2022-02-07 MED ORDER — PROPOFOL 10 MG/ML IV BOLUS
INTRAVENOUS | Status: DC | PRN
  Administered 2022-02-07 (×2): 50 mg via INTRAVENOUS
  Administered 2022-02-07: 100 mg via INTRAVENOUS

## 2022-02-07 MED ORDER — SODIUM CHLORIDE 0.9% FLUSH: 9.0000 mL | INTRAVENOUS | Status: DC | PRN

## 2022-02-07 MED ORDER — IBUPROFEN 800 MG PO TABS: 800.0000 mg | ORAL_TABLET | Freq: Three times a day (TID) | ORAL | 1 refills | Status: AC | PRN

## 2022-02-07 MED ORDER — KETAMINE HCL 10 MG/ML IJ SOLN
INTRAMUSCULAR | Status: DC | PRN
  Administered 2022-02-07 (×2): 10 mg via INTRAVENOUS

## 2022-02-07 MED ORDER — TRAZODONE HCL 100 MG PO TABS: 200.0000 mg | ORAL_TABLET | Freq: Every day | ORAL | Status: DC

## 2022-02-07 MED ORDER — SCOPOLAMINE 1 MG/3DAYS TD PT72
1.0000 | MEDICATED_PATCH | TRANSDERMAL | Status: DC
  Administered 2022-02-07: 1.5 mg via TRANSDERMAL

## 2022-02-07 MED ORDER — LIDOCAINE HCL (PF) 2 % IJ SOLN
INTRAMUSCULAR | Status: AC
  Filled 2022-02-07: qty 5

## 2022-02-07 MED ORDER — GUAIFENESIN 100 MG/5ML PO LIQD
15.0000 mL | ORAL | Status: DC | PRN
  Filled 2022-02-07: qty 15

## 2022-02-07 MED ORDER — POVIDONE-IODINE 10 % EX SWAB: 2.0000 | Freq: Once | CUTANEOUS | Status: DC

## 2022-02-07 MED ORDER — ACETAMINOPHEN 500 MG PO TABS
ORAL_TABLET | ORAL | Status: AC
  Filled 2022-02-07: qty 2

## 2022-02-07 MED ORDER — ACETAMINOPHEN 500 MG PO TABS: 1000.0000 mg | ORAL_TABLET | Freq: Once | ORAL | Status: DC

## 2022-02-07 MED ORDER — MEPERIDINE HCL 25 MG/ML IJ SOLN: 6.2500 mg | INTRAMUSCULAR | Status: DC | PRN

## 2022-02-07 MED ORDER — PHENYLEPHRINE HCL (PRESSORS) 10 MG/ML IV SOLN
INTRAVENOUS | Status: DC | PRN
  Administered 2022-02-07: 160 ug via INTRAVENOUS
  Administered 2022-02-07: 80 ug via INTRAVENOUS

## 2022-02-07 MED ORDER — SCOPOLAMINE 1 MG/3DAYS TD PT72
MEDICATED_PATCH | TRANSDERMAL | Status: AC
  Filled 2022-02-07: qty 1

## 2022-02-07 MED ORDER — DEXAMETHASONE SODIUM PHOSPHATE 4 MG/ML IJ SOLN
INTRAMUSCULAR | Status: DC | PRN
  Administered 2022-02-07: 5 mg via INTRAVENOUS

## 2022-02-07 MED ORDER — ALUM & MAG HYDROXIDE-SIMETH 200-200-20 MG/5ML PO SUSP: 30.0000 mL | ORAL | Status: DC | PRN

## 2022-02-07 MED ORDER — ONDANSETRON HCL 4 MG/2ML IJ SOLN: 4.0000 mg | Freq: Four times a day (QID) | INTRAMUSCULAR | Status: DC | PRN

## 2022-02-07 MED ORDER — DIPHENHYDRAMINE HCL 12.5 MG/5ML PO ELIX: 12.5000 mg | ORAL_SOLUTION | Freq: Four times a day (QID) | ORAL | Status: DC | PRN

## 2022-02-07 MED ORDER — GLYCOPYRROLATE 0.2 MG/ML IJ SOLN
INTRAMUSCULAR | Status: DC | PRN
  Administered 2022-02-07: .2 mg via INTRAVENOUS

## 2022-02-07 MED ORDER — OXYCODONE-ACETAMINOPHEN 5-325 MG PO TABS
1.0000 | ORAL_TABLET | ORAL | Status: DC | PRN
  Administered 2022-02-07 (×2): 1 via ORAL

## 2022-02-07 MED ORDER — BUPIVACAINE HCL (PF) 0.25 % IJ SOLN
INTRAMUSCULAR | Status: DC | PRN
  Administered 2022-02-07: 15 mL

## 2022-02-07 MED ORDER — GLYCOPYRROLATE PF 0.2 MG/ML IJ SOSY
PREFILLED_SYRINGE | INTRAMUSCULAR | Status: AC
  Filled 2022-02-07: qty 1

## 2022-02-07 MED ORDER — CEFAZOLIN SODIUM-DEXTROSE 2-4 GM/100ML-% IV SOLN
2.0000 g | INTRAVENOUS | Status: AC
  Administered 2022-02-07: 2 g via INTRAVENOUS

## 2022-02-07 MED ORDER — CEFAZOLIN SODIUM-DEXTROSE 2-4 GM/100ML-% IV SOLN
INTRAVENOUS | Status: AC
  Filled 2022-02-07: qty 100

## 2022-02-07 MED ORDER — GABAPENTIN 300 MG PO CAPS
ORAL_CAPSULE | ORAL | Status: AC
  Filled 2022-02-07: qty 1

## 2022-02-07 SURGICAL SUPPLY — 79 items
ADH SKN CLS APL DERMABOND .7 (GAUZE/BANDAGES/DRESSINGS) ×2
APL SRG 38 LTWT LNG FL B (MISCELLANEOUS)
APPLICATOR ARISTA FLEXITIP XL (MISCELLANEOUS) IMPLANT
BLADE SURG 10 STRL SS (BLADE) ×1 IMPLANT
CANNULA CAP OBTURATR AIRSEAL 8 (CAP) ×3 IMPLANT
CANNULA REDUC XI 12-8 STAPL (CANNULA)
CANNULA REDUCER 12-8 DVNC XI (CANNULA) IMPLANT
CATH FOLEY 3WAY  5CC 16FR (CATHETERS) ×2
CATH FOLEY 3WAY 5CC 16FR (CATHETERS) ×3 IMPLANT
CELLS DAT CNTRL 66122 CELL SVR (MISCELLANEOUS) IMPLANT
COVER BACK TABLE 60X90IN (DRAPES) ×3 IMPLANT
COVER TIP SHEARS 8 DVNC (MISCELLANEOUS) ×3 IMPLANT
COVER TIP SHEARS 8MM DA VINCI (MISCELLANEOUS) ×2
DEFOGGER SCOPE WARMER CLEARIFY (MISCELLANEOUS) ×3 IMPLANT
DERMABOND ADVANCED .7 DNX12 (GAUZE/BANDAGES/DRESSINGS) ×3 IMPLANT
DILATOR CANAL MILEX (MISCELLANEOUS) ×1 IMPLANT
DRAPE ARM DVNC X/XI (DISPOSABLE) ×12 IMPLANT
DRAPE COLUMN DVNC XI (DISPOSABLE) ×3 IMPLANT
DRAPE DA VINCI XI ARM (DISPOSABLE) ×8
DRAPE DA VINCI XI COLUMN (DISPOSABLE) ×2
DRAPE SURG IRRIG POUCH 19X23 (DRAPES) ×3 IMPLANT
DRAPE UTILITY 15X26 TOWEL STRL (DRAPES) ×3 IMPLANT
DURAPREP 26ML APPLICATOR (WOUND CARE) ×3 IMPLANT
ELECT REM PT RETURN 9FT ADLT (ELECTROSURGICAL) ×2
ELECTRODE REM PT RTRN 9FT ADLT (ELECTROSURGICAL) ×3 IMPLANT
GAUZE 4X4 16PLY ~~LOC~~+RFID DBL (SPONGE) ×3 IMPLANT
GLOVE BIO SURGEON STRL SZ 6.5 (GLOVE) ×9 IMPLANT
HEMOSTAT ARISTA ABSORB 3G PWDR (HEMOSTASIS) IMPLANT
HOLDER FOLEY CATH W/STRAP (MISCELLANEOUS) IMPLANT
IRRIG SUCT STRYKERFLOW 2 WTIP (MISCELLANEOUS) ×2
IRRIGATION SUCT STRKRFLW 2 WTP (MISCELLANEOUS) ×3 IMPLANT
KIT PINK PAD W/HEAD ARE REST (MISCELLANEOUS) ×2
KIT PINK PAD W/HEAD ARM REST (MISCELLANEOUS) ×3 IMPLANT
KIT TURNOVER CYSTO (KITS) ×3 IMPLANT
LEGGING LITHOTOMY PAIR STRL (DRAPES) ×3 IMPLANT
MANIFOLD NEPTUNE II (INSTRUMENTS) ×3 IMPLANT
MANIPULATOR ADVINCU DEL 2.5 PL (MISCELLANEOUS) IMPLANT
MANIPULATOR ADVINCU DEL 3.0 PL (MISCELLANEOUS) ×1 IMPLANT
MANIPULATOR ADVINCU DEL 3.5 PL (MISCELLANEOUS) IMPLANT
MANIPULATOR ADVINCU DEL 4.0 PL (MISCELLANEOUS) IMPLANT
MANIPULATOR UTERINE 4.5 ZUMI (MISCELLANEOUS) ×1 IMPLANT
NDL INSUFFLATION 14GA 120MM (NEEDLE) ×2 IMPLANT
NEEDLE INSUFFLATION 14GA 120MM (NEEDLE) ×2 IMPLANT
OBTURATOR OPTICAL STANDARD 8MM (TROCAR) ×2
OBTURATOR OPTICAL STND 8 DVNC (TROCAR) ×2
OBTURATOR OPTICALSTD 8 DVNC (TROCAR) ×3 IMPLANT
OCCLUDER COLPOPNEUMO (BALLOONS) ×2 IMPLANT
PACK ROBOT WH (CUSTOM PROCEDURE TRAY) ×3 IMPLANT
PACK ROBOTIC GOWN (GOWN DISPOSABLE) ×3 IMPLANT
PAD OB MATERNITY 4.3X12.25 (PERSONAL CARE ITEMS) ×3 IMPLANT
PAD PREP 24X48 CUFFED NSTRL (MISCELLANEOUS) ×3 IMPLANT
PROTECTOR NERVE ULNAR (MISCELLANEOUS) ×3 IMPLANT
RETRACTOR WND ALEXIS 18 MED (MISCELLANEOUS) IMPLANT
RTRCTR WOUND ALEXIS 18CM MED (MISCELLANEOUS)
RTRCTR WOUND ALEXIS 18CM SML (INSTRUMENTS)
SAVER CELL AAL HAEMONETICS (INSTRUMENTS) IMPLANT
SEAL CANN UNIV 5-8 DVNC XI (MISCELLANEOUS) ×6 IMPLANT
SEAL XI 5MM-8MM UNIVERSAL (MISCELLANEOUS) ×4
SEALER VESSEL DA VINCI XI (MISCELLANEOUS)
SEALER VESSEL EXT DVNC XI (MISCELLANEOUS) IMPLANT
SET IRRIG Y TYPE TUR BLADDER L (SET/KITS/TRAYS/PACK) IMPLANT
SET TUBE FILTERED XL AIRSEAL (SET/KITS/TRAYS/PACK) ×3 IMPLANT
SPIKE FLUID TRANSFER (MISCELLANEOUS) ×6 IMPLANT
STAPLER CANNULA SEAL DVNC XI (STAPLE) IMPLANT
STAPLER CANNULA SEAL XI (STAPLE)
SUT VIC AB 0 CT1 27 (SUTURE) ×4
SUT VIC AB 0 CT1 27XBRD ANBCTR (SUTURE) ×2 IMPLANT
SUT VIC AB 4-0 PS2 18 (SUTURE) ×9 IMPLANT
SUT VICRYL 0 UR6 27IN ABS (SUTURE) IMPLANT
SUT VLOC 180 0 9IN  GS21 (SUTURE) ×2
SUT VLOC 180 0 9IN GS21 (SUTURE) ×3 IMPLANT
TIP RUMI ORANGE 6.7MMX12CM (TIP) IMPLANT
TIP UTERINE 5.1X6CM LAV DISP (MISCELLANEOUS) IMPLANT
TIP UTERINE 6.7X10CM GRN DISP (MISCELLANEOUS) IMPLANT
TIP UTERINE 6.7X6CM WHT DISP (MISCELLANEOUS) IMPLANT
TIP UTERINE 6.7X8CM BLUE DISP (MISCELLANEOUS) IMPLANT
TOWEL OR 17X26 10 PK STRL BLUE (TOWEL DISPOSABLE) ×4 IMPLANT
TROCAR PORT AIRSEAL 8X100 (TROCAR) ×1 IMPLANT
WATER STERILE IRR 1000ML POUR (IV SOLUTION) ×3 IMPLANT

## 2022-02-07 NOTE — Interval H&P Note (Signed)
History and Physical Interval Note:  02/07/2022 7:08 AM  [employer]  has presented today for surgery, with the diagnosis of dysplasia of cervix.  The various methods of treatment have been discussed with the patient and family. After consideration of risks, benefits and other options for treatment, the patient has consented to  Procedure(s): XI ROBOTIC ASSISTED LAPAROSCOPIC HYSTERECTOMY AND SALPINGECTOMY (Bilateral) CYSTOSCOPY (N/A) as a surgical intervention.  The patient's history has been reviewed, patient examined, no change in status, stable for surgery.  I have reviewed the patient's chart and labs.  Questions were answered to the patient's satisfaction.    Day of surgery patient requests also B oopherectomy - d/w pt r/b/a of adding this to procedure, will proceed.    Erika Ford

## 2022-02-07 NOTE — Discharge Summary (Signed)
Physician Discharge Summary  Patient ID: Erika Ford MRN: [id number] DOB/AGE: 05/31/1970 52 y.o.  Admit date: 02/07/2022 Discharge date: 02/07/2022  Admission Diagnoses: cervical dysplasia  Discharge Diagnoses:  Principal Problem:   S/P laparoscopic hysterectomy Active Problems:   S/P robot-assisted surgical procedure   Discharged Condition: good  Hospital Course: admitted 1/30 for Robot Assisted total laparoscopic hysterectomy, underwent procedure - see op note.  Postop course uncomplicated - at time of discharge ambulating, voiding, tolerating po Pain controlled.  D/C with percocet, phenergan and ibuprofen.  Consults: None  Significant Diagnostic Studies: labs: CBC and BMP  Treatments: IV hydration, analgesia: percocet, dilaudid and Zofran, and surgery: robot assisted total laparoscopic hysterectomy/ B salpingoopherectomy  Discharge Exam: Blood pressure 120/81, pulse 75, temperature 99.2 F (37.3 C), resp. rate 14, height 5\' 1"  (1.549 m), weight 50.8 kg, last menstrual period 06/23/2012, SpO2 94 %. General appearance: alert and no distress Resp: clear to auscultation bilaterally Cardio: regular rate and rhythm GI: soft, non-tender; bowel sounds normal; no masses,  no organomegaly Extremities: extremities normal, atraumatic, no cyanosis or edema Incision/Wound: C/D/I  Disposition: Discharge disposition: 01-Home or Self Care       Discharge Instructions     Call MD for:  persistant nausea and vomiting   Complete by: As directed    Call MD for:  redness, tenderness, or signs of infection (pain, swelling, redness, odor or green/yellow discharge around incision site)   Complete by: As directed    Call MD for:  severe uncontrolled pain   Complete by: As directed    Diet - low sodium heart healthy   Complete by: As directed    Discharge instructions   Complete by: As directed    Call [phone] with questions or problems  Please pick up Miralax at drug store,  take 1-2x/day   Driving Restrictions   Complete by: As directed    While taking strong pain medicine   Increase activity slowly   Complete by: As directed    Lifting restrictions   Complete by: As directed    No greater than 10-15lbs for 6 weeks   May shower / Bathe   Complete by: As directed    May walk up steps   Complete by: As directed    Sexual Activity Restrictions   Complete by: As directed    Pelvic rest - no douching, tampons or sex for 6 weeks      Allergies as of 02/07/2022   No Active Allergies      Medication List     TAKE these medications    ibuprofen 800 MG tablet Commonly known as: ADVIL Take 1 tablet (800 mg total) by mouth every 8 (eight) hours as needed for moderate pain. What changed:  medication strength when to take this reasons to take this   oxyCODONE-acetaminophen 5-325 MG tablet Commonly known as: PERCOCET/ROXICET Take 1-2 tablets by mouth every 6 (six) hours as needed for severe pain (moderate to severe pain (when tolerating fluids)).   promethazine 12.5 MG tablet Commonly known as: PHENERGAN Take 1 tablet (12.5 mg total) by mouth every 6 (six) hours as needed for nausea (1/2 - 1 tablet).   traZODone 100 MG tablet Commonly known as: DESYREL Take 200 mg by mouth at bedtime.   valACYclovir 500 MG tablet Commonly known as: VALTREX Take 500 mg by mouth daily.        Follow-up Information     Bovard-Stuckert, Christabelle Hanzlik, MD. Call in 2 week(s).   Specialty: Obstetrics  and Gynecology Why: for postop check and 6 weeks for postop check. Contact information: [address] [address] [place] [address] [phone]                 Signed: Janyth Contes 02/07/2022, 5:33 PM

## 2022-02-07 NOTE — Brief Op Note (Signed)
02/07/2022  10:11 AM  PATIENT:  Duwayne Heck Larke  52 y.o. female  PRE-OPERATIVE DIAGNOSIS:  dysplasia of cervix  POST-OPERATIVE DIAGNOSIS:  dysplasia of cervix  PROCEDURE:  Procedure(s): XI ROBOTIC ASSISTED LAPAROSCOPIC HYSTERECTOMY BILATERAL SALPINGO-OOPHERETOMY (Bilateral)  SURGEON:  Surgeon(s) and Role:    * Bovard-Stuckert, Latia Mataya, MD - Primary  ASSISTANTS: Gaylord Shih RNFA   ANESTHESIA:   local and general  EBL:  50 mL IVF and urine per anesthesia, clear urine at end of procedure  DRAINS: Urinary Catheter (Foley)   LOCAL MEDICATIONS USED:  MARCAINE     SPECIMEN:  Source of Specimen:  uterus, cervix, B fallopian tubes and ovaries  DISPOSITION OF SPECIMEN:  PATHOLOGY  COUNTS:  YES  TOURNIQUET:  * No tourniquets in log *  DICTATION: .Other Dictation: Dictation Number [id number]  PLAN OF CARE:  Admit for extended recovery  PATIENT DISPOSITION:  PACU - hemodynamically stable.   Delay start of Pharmacological VTE agent (>24hrs) due to surgical blood loss or risk of bleeding: not applicable

## 2022-02-07 NOTE — Anesthesia Procedure Notes (Signed)
Procedure Name: Intubation Date/Time: 02/07/2022 7:32 AM  Performed by: Justice Rocher, CRNAPre-anesthesia Checklist: Patient identified, Emergency Drugs available, Suction available, Patient being monitored and Timeout performed Patient Re-evaluated:Patient Re-evaluated prior to induction Oxygen Delivery Method: Circle system utilized Preoxygenation: Pre-oxygenation with 100% oxygen Induction Type: IV induction Ventilation: Mask ventilation without difficulty Laryngoscope Size: Mac and 3 Grade View: Grade II Tube type: Oral Tube size: 7.0 mm Number of attempts: 1 Airway Equipment and Method: Stylet and Oral airway Placement Confirmation: ETT inserted through vocal cords under direct vision, positive ETCO2, breath sounds checked- equal and bilateral and CO2 detector Secured at: 23 cm Tube secured with: Tape Dental Injury: Teeth and Oropharynx as per pre-operative assessment

## 2022-02-07 NOTE — Progress Notes (Signed)
Day of Surgery Procedure(s) (LRB): XI ROBOTIC ASSISTED LAPAROSCOPIC HYSTERECTOMY BILATERAL SALPINGO-OOPHERETOMY (Bilateral)  Subjective: Patient reports ambulating, tolerating po, pain controlled, awaiting voiding.  Pt states feels reasonable  Objective: I have reviewed patient's vital signs, intake and output, medications, and labs.  General: alert and no distress Resp: clear to auscultation bilaterally Cardio: regular rate and rhythm GI: soft, non-tender; bowel sounds normal; no masses,  no organomegaly and incision: clean, dry, and intact Extremities: extremities normal, atraumatic, no cyanosis or edema  Assessment: s/p Procedure(s): XI ROBOTIC ASSISTED LAPAROSCOPIC HYSTERECTOMY BILATERAL SALPINGO-OOPHERETOMY (Bilateral): stable and progressing well  Plan: Encourage ambulation Discharge home with phenergan, percocet, ibuprofen.  F/u 2 weeks and 6 weeks.   Given precautions, d/w pt expectations   LOS: 0 days    Janyth Contes, MD 02/07/2022, 4:50 PM

## 2022-02-07 NOTE — Op Note (Signed)
NAMERHANDI, DESPAIN MEDICAL RECORD NO: [id number] ACCOUNT NO: [id number] DATE OF BIRTH: [birth year] FACILITY: [facility] LOCATION: [facility] PHYSICIAN: Janyth Contes, MD  Operative Report   DATE OF PROCEDURE: 02/07/2022  PREOPERATIVE DIAGNOSIS:  Cervical dysplasia.  POSTOPERATIVE DIAGNOSIS:  Cervical dysplasia.  PROCEDURE:  Da Vinci robot-assisted total laparoscopic hysterectomy with bilateral salpingo-oophorectomy.  SURGEON: Janyth Contes, M.D.  ASSISTANT:  Anson Crofts, RNFA  COMPLICATIONS:  There was a uterine perforation into the area of the bladder flap.  PATHOLOGY: Uterus, cervix, bilateral fallopian tubes and ovaries.  ESTIMATED BLOOD LOSS:  50 mL  IV FLUID AND URINE OUTPUT: Per anesthesia.  Urine was clear at the end of the procedure.  DESCRIPTION OF PROCEDURE:  After informed consent was reviewed with the patient including risks, benefits and alternatives of surgical procedure she was transported to the operating room, placed on table in supine position.  General anesthesia was  induced and found to be adequate.  She was then prepped and draped in the normal sterile fashion.  Given that her cervix was nearly flush with her vaginal canal it was grasped with a single tooth tenaculum and the avenicula was attempted to be placed.  The cup  size was a 3, uterus was posterior sounded to approximately 7 cm, however the Milex dilator was needed to be used and was similarly bent in the process, the gloves and gown were changed.  Attention was turned to the abdominal portion of the case and  approximately 8 mm supraumbilical incision was made.  The fascia was cleared off with hemostat.  Using a Veress needle, the pneumoperitoneum was obtained, a trocar was then placed. A brief pelvic survey revealed the above-mentioned perforation.  The  Foley catheter was revealing clear urine and no air in the bag.  On examination, it appeared to be where the bladder flap would be, the  decision was made to remove this and proceed with a ZUMI and a cup being held up against the cervix for  the rest of the procedure.  This was placed under direct visualization.  The accessory ports had been placed prior to placing the RUMI.  The uterus appeared to be severely retroverted and retroflexed.  The accessory ports were placed on both the right  and left under direct visualization. After placement of the trocars the robot was docked and targeted, was then seated at the console and the procedure was commenced, her left fimbria was grasped and to the level of the cornu her fimbria and tube and  ovary were excised.  The round ligaments were severely atrophic.  They were ligated and then to the level of the uterine arteries, the cardinal ligament was ligated. Then, attention was turned to the right side,  which, in a similar fashion, the fimbria and ovary and tube were excised to the level of the cornu.  The round ligament was again severely atrophic, ligated and the cardinal ligaments were ligated to the level of the uterine arteries with the vessel  sealer.  The vessel sealer was removed and a monopolar scissor was placed.  The anterior colpotomy was made, the posterior colpotomy was attempted to be made as well.  The anterior and posterior colpotomy were made and the uterine arteries were ligated  prior to connection of anterior and posterior colpotomy.  The colpotomy was completed, the uterus, cervix, bilateral fallopian tubes and ovaries were delivered.  The cervical cuff was closed with V-Loc.  Suction irrigation was performed.  The instruments were then removed.  The trocar  sites were closed with 4-0 Vicryl in a typical fashion.  The patient tolerated the procedure well.  Sponge, lap and needle counts were correct x2 per the operating staff.   SHY D: 02/07/2022 10:24:36 am T: 02/07/2022 11:08:00 am  JOB: [id number]/ [id number]

## 2022-02-07 NOTE — Anesthesia Preprocedure Evaluation (Signed)
Anesthesia Evaluation  Patient identified by MRN, date of birth, ID band Patient awake    Reviewed: Allergy & Precautions, H&P , NPO status , Patient's Chart, lab work & pertinent test results  History of Anesthesia Complications (+) PONV and history of anesthetic complications  Airway Mallampati: II  TM Distance: >3 FB Neck ROM: Full    Dental no notable dental hx.    Pulmonary neg pulmonary ROS, former smoker   Pulmonary exam normal breath sounds clear to auscultation       Cardiovascular negative cardio ROS Normal cardiovascular exam Rhythm:Regular Rate:Normal     Neuro/Psych  Headaches  negative psych ROS   GI/Hepatic Neg liver ROS,GERD  Controlled,,  Endo/Other  negative endocrine ROS    Renal/GU negative Renal ROS  negative genitourinary   Musculoskeletal negative musculoskeletal ROS (+)    Abdominal   Peds negative pediatric ROS (+)  Hematology negative hematology ROS (+) Hb 13.1 , plt 355   Anesthesia Other Findings   Reproductive/Obstetrics negative OB ROS                             Anesthesia Physical Anesthesia Plan  ASA: 1  Anesthesia Plan: General   Post-op Pain Management: Toradol IV (intra-op)*, Tylenol PO (pre-op)*, Dilaudid IV and Ketamine IV*   Induction: Intravenous  PONV Risk Score and Plan: 4 or greater and Ondansetron, Dexamethasone, Midazolam, Scopolamine patch - Pre-op and Treatment may vary due to age or medical condition  Airway Management Planned: Oral ETT  Additional Equipment: None  Intra-op Plan:   Post-operative Plan: Extubation in OR  Informed Consent: I have reviewed the patients History and Physical, chart, labs and discussed the procedure including the risks, benefits and alternatives for the proposed anesthesia with the patient or authorized representative who has indicated his/her understanding and acceptance.     Dental advisory  given  Plan Discussed with: CRNA  Anesthesia Plan Comments: (Last airway note: Laryngoscope Size: Miller and 2 Grade View: Grade I Tube type: Oral Tube size: 7.0 mm Number of attempts: 1 )        Anesthesia Quick Evaluation

## 2022-02-07 NOTE — Anesthesia Postprocedure Evaluation (Signed)
Anesthesia Post Note  Patient: [employer]  Procedure(s) Performed: XI ROBOTIC ASSISTED LAPAROSCOPIC HYSTERECTOMY BILATERAL SALPINGO-OOPHERETOMY (Bilateral)     Patient location during evaluation: PACU Anesthesia Type: General Level of consciousness: awake and alert, oriented and patient cooperative Pain management: pain level controlled Vital Signs Assessment: post-procedure vital signs reviewed and stable Respiratory status: spontaneous breathing, nonlabored ventilation and respiratory function stable Cardiovascular status: blood pressure returned to baseline and stable Postop Assessment: no apparent nausea or vomiting Anesthetic complications: no   No notable events documented.  Last Vitals:  Vitals:   02/07/22 1015 02/07/22 1030  BP: (!) 147/95 (!) 136/90  Pulse: 80 65  Resp: 15 11  Temp:    SpO2: 100% 93%    Last Pain:  Vitals:   02/07/22 1015  TempSrc:   PainSc: [place]

## 2022-02-07 NOTE — Transfer of Care (Signed)
Immediate Anesthesia Transfer of Care Note  Patient: Erika Ford  Procedure(s) Performed: Procedure(s) (LRB): XI ROBOTIC ASSISTED LAPAROSCOPIC HYSTERECTOMY BILATERAL SALPINGO-OOPHERETOMY (Bilateral)  Patient Location: PACU  Anesthesia Type: General  Level of Consciousness: awake, sedated, patient cooperative and responds to stimulation  Airway & Oxygen Therapy: Patient Spontanous Breathing and Patient connected to [place] oxygen  Post-op Assessment: Report given to PACU RN, Post -op Vital signs reviewed and stable and Patient moving all extremities  Post vital signs: Reviewed and stable  Complications: No apparent anesthesia complications

## 2022-02-08 ENCOUNTER — Encounter (HOSPITAL_BASED_OUTPATIENT_CLINIC_OR_DEPARTMENT_OTHER): Payer: Self-pay | Admitting: Obstetrics and Gynecology

## 2022-02-09 LAB — SURGICAL PATHOLOGY

## 2022-06-07 DIAGNOSIS — N898 Other specified noninflammatory disorders of vagina: Secondary | ICD-10-CM | POA: Diagnosis not present

## 2022-06-07 DIAGNOSIS — N905 Atrophy of vulva: Secondary | ICD-10-CM | POA: Diagnosis not present

## 2022-06-07 DIAGNOSIS — B3731 Acute candidiasis of vulva and vagina: Secondary | ICD-10-CM | POA: Diagnosis not present

## 2022-08-17 DIAGNOSIS — Z20822 Contact with and (suspected) exposure to covid-19: Secondary | ICD-10-CM | POA: Diagnosis not present

## 2022-08-17 DIAGNOSIS — J069 Acute upper respiratory infection, unspecified: Secondary | ICD-10-CM | POA: Diagnosis not present

## 2022-08-17 DIAGNOSIS — J029 Acute pharyngitis, unspecified: Secondary | ICD-10-CM | POA: Diagnosis not present

## 2022-08-17 DIAGNOSIS — R059 Cough, unspecified: Secondary | ICD-10-CM | POA: Diagnosis not present

## 2023-07-05 DIAGNOSIS — R519 Headache, unspecified: Secondary | ICD-10-CM | POA: Diagnosis not present

## 2023-07-05 DIAGNOSIS — R110 Nausea: Secondary | ICD-10-CM | POA: Diagnosis not present

## 2023-07-05 DIAGNOSIS — M791 Myalgia, unspecified site: Secondary | ICD-10-CM | POA: Diagnosis not present

## 2023-07-05 DIAGNOSIS — M6281 Muscle weakness (generalized): Secondary | ICD-10-CM | POA: Diagnosis not present
